# Patient Record
Sex: Female | Born: 1960 | Race: Asian | Hispanic: No | Marital: Single | Smoking: Never smoker
Health system: Southern US, Community
[De-identification: ages and names within clinical notes are randomized; demographics above are authoritative.]

## PROBLEM LIST (undated history)

## (undated) DIAGNOSIS — R51 Headache: Secondary | ICD-10-CM

## (undated) DIAGNOSIS — T8859XA Other complications of anesthesia, initial encounter: Secondary | ICD-10-CM

## (undated) DIAGNOSIS — T4145XA Adverse effect of unspecified anesthetic, initial encounter: Secondary | ICD-10-CM

## (undated) DIAGNOSIS — M1990 Unspecified osteoarthritis, unspecified site: Secondary | ICD-10-CM

## (undated) DIAGNOSIS — M549 Dorsalgia, unspecified: Secondary | ICD-10-CM

## (undated) HISTORY — PX: APPENDECTOMY: SHX54

---

## 1981-03-12 DIAGNOSIS — A150 Tuberculosis of lung: Secondary | ICD-10-CM | POA: Insufficient documentation

## 2000-12-30 ENCOUNTER — Other Ambulatory Visit: Admission: RE | Admit: 2000-12-30 | Discharge: 2000-12-30 | Payer: Self-pay | Admitting: *Deleted

## 2001-10-15 ENCOUNTER — Ambulatory Visit (HOSPITAL_COMMUNITY): Admission: RE | Admit: 2001-10-15 | Discharge: 2001-10-15 | Payer: Self-pay | Admitting: Obstetrics & Gynecology

## 2001-10-15 ENCOUNTER — Encounter (INDEPENDENT_AMBULATORY_CARE_PROVIDER_SITE_OTHER): Payer: Self-pay | Admitting: Specialist

## 2002-06-10 ENCOUNTER — Inpatient Hospital Stay (HOSPITAL_COMMUNITY): Admission: EM | Admit: 2002-06-10 | Discharge: 2002-06-11 | Payer: Self-pay | Admitting: Psychiatry

## 2002-06-10 ENCOUNTER — Emergency Department (HOSPITAL_COMMUNITY): Admission: EM | Admit: 2002-06-10 | Discharge: 2002-06-10 | Payer: Self-pay | Admitting: Emergency Medicine

## 2009-11-16 ENCOUNTER — Ambulatory Visit: Payer: Self-pay | Admitting: Internal Medicine

## 2009-11-16 DIAGNOSIS — F17200 Nicotine dependence, unspecified, uncomplicated: Secondary | ICD-10-CM | POA: Insufficient documentation

## 2009-11-16 DIAGNOSIS — F329 Major depressive disorder, single episode, unspecified: Secondary | ICD-10-CM

## 2009-11-16 DIAGNOSIS — K219 Gastro-esophageal reflux disease without esophagitis: Secondary | ICD-10-CM

## 2009-12-20 ENCOUNTER — Ambulatory Visit: Payer: Self-pay | Admitting: Internal Medicine

## 2009-12-20 DIAGNOSIS — M76899 Other specified enthesopathies of unspecified lower limb, excluding foot: Secondary | ICD-10-CM | POA: Insufficient documentation

## 2010-01-19 ENCOUNTER — Emergency Department (HOSPITAL_COMMUNITY)
Admission: EM | Admit: 2010-01-19 | Discharge: 2010-01-19 | Payer: Self-pay | Source: Home / Self Care | Admitting: Family Medicine

## 2010-01-19 ENCOUNTER — Telehealth (INDEPENDENT_AMBULATORY_CARE_PROVIDER_SITE_OTHER): Payer: Self-pay | Admitting: Internal Medicine

## 2010-01-27 ENCOUNTER — Telehealth (INDEPENDENT_AMBULATORY_CARE_PROVIDER_SITE_OTHER): Payer: Self-pay | Admitting: Internal Medicine

## 2010-01-30 ENCOUNTER — Ambulatory Visit: Payer: Self-pay | Admitting: Nurse Practitioner

## 2010-01-30 DIAGNOSIS — M5430 Sciatica, unspecified side: Secondary | ICD-10-CM | POA: Insufficient documentation

## 2010-02-07 ENCOUNTER — Telehealth (INDEPENDENT_AMBULATORY_CARE_PROVIDER_SITE_OTHER): Payer: Self-pay | Admitting: Internal Medicine

## 2010-02-07 ENCOUNTER — Encounter (INDEPENDENT_AMBULATORY_CARE_PROVIDER_SITE_OTHER): Payer: Self-pay | Admitting: Internal Medicine

## 2010-03-03 ENCOUNTER — Encounter (INDEPENDENT_AMBULATORY_CARE_PROVIDER_SITE_OTHER): Payer: Self-pay | Admitting: *Deleted

## 2010-04-03 ENCOUNTER — Encounter: Payer: Self-pay | Admitting: Family Medicine

## 2010-04-11 ENCOUNTER — Ambulatory Visit: Admit: 2010-04-11 | Payer: Self-pay | Admitting: Internal Medicine

## 2010-04-11 ENCOUNTER — Encounter (INDEPENDENT_AMBULATORY_CARE_PROVIDER_SITE_OTHER): Payer: Self-pay | Admitting: Internal Medicine

## 2010-04-11 ENCOUNTER — Encounter: Payer: Self-pay | Admitting: Internal Medicine

## 2010-04-11 DIAGNOSIS — I8390 Asymptomatic varicose veins of unspecified lower extremity: Secondary | ICD-10-CM | POA: Insufficient documentation

## 2010-04-11 NOTE — Progress Notes (Signed)
Summary: MEDICINE IS NOT WORKING  Phone Note Call from Patient   Summary of Call: PLEASE CALL PATIENT MEDICINE IS NOT WORKING SHE NEEDS TO TALK TO DR  Delrae Alfred, PHONE [phone] Initial call taken by: Domenic Polite,  January 27, 2010 2:02 PM  Follow-up for Phone Call        Left message on answering machine for pt. to return call.  Dutch Quint RN  January 27, 2010 2:36 PM  Can't stand long, has trouble when walking.  Right leg/hip is very painful.  Giving her trouble at work, is able to sleep at night.   Dx with sciatica at [facility],  completed predpack.  Has tried heating pad; when resting, pain doesn't bother her.  States ibuprofen is not working, would like to know if there is anything else she can take.  Uses [facility] on [address]  [facility] notes are on your desk. Follow-up by: Dutch Quint RN,  January 27, 2010 2:51 PM  Additional Follow-up for Phone Call Additional follow up Details #1::        Reviewed ER visit She needs f/u scheduled with Dr. Delrae Alfred would advise everything that she is doing will only add starting neurontin 300mg  by mouth nightly.  This may make her sleepy but may help some with the pain. If no improvement after 3 nights with one pill may increase to 2 capsules by mouth nightly.  Shiela to fax rx to [facility] Additional Follow-up by: Lehman Prom FNP,  January 27, 2010 3:56 PM    Additional Follow-up for Phone Call Additional follow up Details #2::    Pt. notified of Rx and Kaelie Henigan's instructions.  Appt. with Dr. Delrae Alfred 02/21/10.  Dutch Quint RN  January 27, 2010 4:47 PM   New/Updated Medications: GABAPENTIN 300 MG CAPS (GABAPENTIN) One capsule by mouth nightly for 3 days then may increase to 2 capsules by mouth nightly Prescriptions: GABAPENTIN 300 MG CAPS (GABAPENTIN) One capsule by mouth nightly for 3 days then may increase to 2 capsules by mouth nightly  #50 x 0   Entered and Authorized by:   Lehman Prom FNP   Signed by:   Lehman Prom  FNP on 01/27/2010   Method used:   Printed then faxed to ...       [facility] [address] [phone]* (retail)       [address]       [place], [place]  [address]       Ph: [phone]       Fax: [phone]   RxID:   [id number]    X-ray  Procedure date:  01/19/2010  Findings:      lumbar - no acute findings

## 2010-04-11 NOTE — Progress Notes (Signed)
Summary: need to speak with the nurse.  Phone Note Call from Patient   Summary of Call: please call patient she is having bad right leg pain and the medicine that dr Delrae Alfred give to her is not working, she was mad that we don't have app. avaliable and she wants to change medicine for anotherone. phone [phone] Initial call taken by: Domenic Polite,  January 19, 2010 8:18 AM  Follow-up for Phone Call        Called pt. -- informed that she is currently at the urgent care. Requested her to call back if needed.  Dutch Quint RN  January 19, 2010 9:28 AM

## 2010-04-11 NOTE — Assessment & Plan Note (Signed)
Summary: 2 MONTH FU FOR TOBACCO CESSATION//KT   Vitals Entered By: Gaylyn Cheers RN (February 07, 2010 11:38 AM) CC: pt here to see MD however MD not available. severe pain rt hip and leg, has been taking Ibuprofen, oxycodone, gabapentin with little  relief. Almost out of Ibuprofen will discuss with Dr. Delrae Alfred. PT ordered pt aware   CC:  pt here to see MD however MD not available. severe pain rt hip and leg, has been taking Ibuprofen, oxycodone, and gabapentin with little  relief. Almost out of Ibuprofen will discuss with Dr. Delrae Alfred. PT ordered pt aware.  History of Present Illness: Patient not seen today as Sylvia [name] had to leave office emergently. Has been seen now 3 times for what has been diagnosed as sciatica and right greater trochanteric bursitis.  Lumbosacral Xrays in ED were normal.  Has been treated with hydrocodone, prednisone dose pak, Depomedrol, Toradol, Ibuprofen with little improvement.   Needs to go to PT. Order written. Should be reappointed with me next week.  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 1 tab by mouth up to three times a day with food.    Vicodin 5-500 Mg Tabs (Hydrocodone-acetaminophen) ..... One tablet by mouth daily as needed for extreme pain  Orders: Physical Therapy Referral (PT) Physical Therapy Referral (PT)  Complete Medication List: 1)  Effexor Xr 75 Mg Xr24h-cap (Venlafaxine hcl) .Marland Kitchen.. 1 cap by mouth daily 2)  Famotidine 40 Mg Tabs (Famotidine) .Marland Kitchen.. 1 tab by mouth daily at bedtime 3)  Chantix Starting Month Pak 0.5 Mg X 11 & 1 Mg X 42 Tabs (Varenicline tartrate) .... Take 0.5 mg once daily for 3 days, then increase to two times a day.  on day 8, increase to 1 mg two times  a day and stay on that dose 4)  Chantix 1 Mg Tabs (Varenicline tartrate) .Marland Kitchen.. 1 tab by mouth two times a day 5)  Ibuprofen 600 Mg Tabs (Ibuprofen) .Marland Kitchen.. 1 tab by mouth up to three times a day with food. 6)  Gabapentin 300 Mg Caps (Gabapentin) .... One capsule by mouth nightly for 3 days then may increase to 2 capsules by mouth nightly 7)  Vicodin 5-500 Mg Tabs (Hydrocodone-acetaminophen) .... One tablet by mouth daily as needed for extreme pain  Patient Instructions: 1)  Reappoint with me next week on Monday. 2)  She is using the Vicodin too frequently--went through 30 in 9 days. 3)  Will give her 10 more--to only use when pain severe--please call into pharmacy of her choice--see order 4)  Have her try Aleve 2 tabs two times a day regularly --take with food.  Stop all other antiinflammatory meds. 5)  She is to let us know if she does not hear from PT in next 1-2 weeks. Prescriptions: VICODIN 5-500 MG TABS (HYDROCODONE-ACETAMINOPHEN) One tablet by mouth daily as needed for extreme pain  #10 x 0   Entered and Authorized by:   Julieanne Manson MD   Signed by:   Julieanne Manson MD on 02/07/2010   Method used:   Telephoned to ...       [facility] [address] [phone]* (retail)       [address]       [place]  [place], [place]  [address]       Ph: [phone]       Fax: [phone]   RxID:   [id number]    Orders Added: 1)  Physical Therapy Referral [PT] 2)  Physical Therapy Referral [PT]

## 2010-04-11 NOTE — Assessment & Plan Note (Signed)
Summary: Sciatica   Vital Signs:  Patient profile:   50 year old female Menstrual status:  postmenopausal Weight:      105.4 pounds Temp:     97.5 degrees F oral Pulse rate:   100 / minute Pulse rhythm:   regular Resp:     20 per minute BP sitting:   100 / 80  (left arm) Cuff size:   regular  Vitals Entered By: Levon Hedger (January 30, 2010 10:25 AM) CC: right hip pain  x 2-3 months went to [facility]  and medication given is not working...alot of discomfort and pain when walking Is Patient Diabetic? No Pain Assessment Patient in pain? yes     Location: muscle, hip Intensity: 10 Onset of pain  Constant  Does patient need assistance? Functional Status Self care Ambulation Normal   CC:  right hip pain  x 2-3 months went to [facility]  and medication given is not working...alot of discomfort and pain when walking.  History of Present Illness:  Pt into the office for f/u on right leg pain. Pt was seen in the urgent care of 01/19/2010 and was prescribed prednisone taper and ibuprofen and symptoms did not improve. She called to this office on last week and was ordered gabapentin which she started 3 days ago. She reports that it makes her sleepy. Pain in only in her right hip and right calf no radiation of pain - no back pain Pt reports that she was bowling on the day prior to onset of pain. 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 Pt is off work today.  Allergies (verified): No Known Drug Allergies  Review of Systems General:  Denies fever. CV:  Denies chest pain or discomfort. Resp:  Denies cough. GI:  Denies abdominal pain, nausea, and vomiting. GU:  Denies discharge and urinary frequency. MS:  Complains of joint pain; right hip pain, right leg pain.  Physical Exam  General:  alert.   Head:  normocephalic.   Lungs:  normal breath  sounds.   Heart:  normal rate and regular rhythm.   Abdomen:  normal bowel sounds.   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 1 tab by mouth up to three times a day with food.    Vicodin 5-500 Mg Tabs (Hydrocodone-acetaminophen) ..... One tablet by mouth daily as needed for extreme pain  Orders: Admin of Therapeutic Inj  intramuscular or subcutaneous ([address]) Ketorolac-Toradol 15mg  (Y7829) Depo- Medrol 80mg  (J1040)  Problem # 2:  TROCHANTERIC BURSITIS, RIGHT (ICD-726.5) pt may need physical therapy  Problem # 3:  GERD (ICD-530.81) advised pt to take medications since she is taking pain meds Her  updated medication list for this problem includes:    Famotidine 40 Mg Tabs (Famotidine) .Marland Kitchen... 1 tab by mouth daily at bedtime  Complete Medication List: 1)  Effexor Xr 75 Mg Xr24h-cap (Venlafaxine hcl) .Marland Kitchen.. 1 cap by mouth daily 2)  Famotidine 40 Mg Tabs (Famotidine) .Marland Kitchen.. 1 tab by mouth daily at bedtime 3)  Chantix Starting Month Pak 0.5 Mg X 11 & 1 Mg X 42 Tabs (Varenicline tartrate) .... Take 0.5 mg once daily for 3 days, then increase to two times a day.  on day 8, increase to 1 mg two times a day and stay on that dose 4)  Chantix 1 Mg Tabs (Varenicline tartrate) .Marland Kitchen.. 1 tab by mouth two times a day 5)  Ibuprofen 600 Mg Tabs (Ibuprofen) .Marland Kitchen.. 1 tab by mouth up to three times a day with food. 6)  Gabapentin 300 Mg Caps (Gabapentin) .... One capsule by mouth nightly for 3 days then  may increase to 2 capsules by mouth nightly 7)  Vicodin 5-500 Mg Tabs (Hydrocodone-acetaminophen) .... One tablet by mouth daily as needed for extreme pain  Patient Instructions: 1)  Increase gabapentin to 2 capsules by mouth nightly 2)  Continue to take ibuprofen 600mg  by mouth two times a day with food for inflammation - it is important that you keep taking this to help with swelling. 3)  May take vicodin daily as needed for pain.  This is a one time prescription of pain medications 4)  Avoid any activities that may inflame the area. 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 Patient Level III [99213] 2)  Admin of Therapeutic Inj  intramuscular or subcutaneous [96372] 3)  Ketorolac-Toradol 15mg  [J1885] 4)  Depo- Medrol 80mg  [J1040]     Medication Administration  Injection # 1:    Medication: Depo- Medrol 80mg     Diagnosis: SCIATICA, RIGHT (ICD-724.3)    Route: IM    Site: LUOQ gluteus    Exp  Date: 08/2012    Lot #: [id number]    Mfr: Pharmacia    Patient tolerated injection without complications    Given by: Levon Hedger (January 30, 2010 11:49 AM)  Injection # 2:    Medication: Ketorolac-Toradol 15mg     Diagnosis: SCIATICA, RIGHT (ICD-724.3)    Route: IM  Site: RUOQ gluteus    Exp Date: 10/12    Lot #: [id number]    Mfr: workhardt    Patient tolerated injection without complications    Given by: Levon Hedger (January 30, 2010 11:49 AM)  Orders Added: 1)  Est. Patient Level III [74259] 2)  Admin of Therapeutic Inj  intramuscular or subcutaneous [96372] 3)  Ketorolac-Toradol 15mg  [J1885] 4)  Depo- Medrol 80mg  [J1040]

## 2010-04-11 NOTE — Progress Notes (Signed)
Summary: Vicodin refill  Phone Note Outgoing Call   Summary of Call: Juliette Alcide:  please see patient instructions in note--call in Vicodin to pharmacy of her choice--no more until reevaluated next Monday. Please give her rest of info in the instructions. Initial call taken by: Julieanne Manson MD,  February 07, 2010 4:29 PM  Follow-up for Phone Call        Pt. notified, Rx called to [facility]. Has appt. 02/13/10. Will start Aleve. Follow-up by: Gaylyn Cheers RN,  February 07, 2010 4:49 PM

## 2010-04-11 NOTE — Assessment & Plan Note (Signed)
Summary: FU ON DEPRESSON/GERD/SMOKING//KT   Vital Signs:  Patient profile:   50 year old female Menstrual status:  postmenopausal Weight:      107.5 pounds Temp:     96.8 degrees F oral Pulse rate:   80 / minute Resp:     18 per minute BP sitting:   120 / 90  Vitals Entered By: Michelle Nasuti (December 20, 2009 10:45 AM) CC: FOLLOw-up visit. MED REFILLS NEEDED. C/O L HIP PAIN Pain Assessment Patient in pain? yes       Does patient need assistance? Ambulation Normal   CC:  FOLLOw-up visit. MED REFILLS NEEDED. C/O L HIP PAIN.  History of Present Illness: 1.  Depression:  Started back on name brand Effexor and feeling much better.  Sleeping better as well.    2.  Epigastric Pain:  has not been consistent with Famotidine--keeps forgetting to take at night.  Pain is not so frequent, however.  Has not made any changes related to eating and elevation HOB.  3.  Tobacco Abuse:   Did get Chantix--has not started yet as concerned she will get depressed.  4.  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  No numbness or tingling down leg  Allergies: No Known Drug Allergies  Physical Exam  General:  NAD Abdomen:  soft and non-tender.   Extremities:  Tender over right greater trochanter--reproduces pain in thigh and leg.  Discomfort with crossing right ankle over left knee in same area.   Impression & Recommendations:  Problem # 1:  DEPRESSION (ICD-311) Much improved Reiterated how to call in for refills so does not run out. Her updated medication list for this problem includes:    Effexor Xr 75 Mg Xr24h-cap (Venlafaxine hcl) .Marland Kitchen... 1 cap by mouth daily  Problem # 2:  GERD (ICD-530.81) To take  Famotidine in morning before breakfast Her updated medication list for this problem includes:    Famotidine 40 Mg Tabs (Famotidine) .Marland Kitchen... 1 tab by mouth daily at bedtime  Problem # 3:  TOBACCO ABUSE (ICD-305.1) To start Chantix after on Effexor  for one more month--to stop and call if increased depression Her updated medication list for this problem includes:    Chantix Starting Month Pak 0.5 Mg X 11 & 1 Mg X 42 Tabs (Varenicline tartrate) .Marland Kitchen... Take 0.5 mg once daily for 3 days, then increase to two times a day.  on day 8, increase to 1 mg two times a day and stay on that dose    Chantix 1 Mg Tabs (Varenicline tartrate) .Marland Kitchen... 1 tab by mouth two times a day  Problem # 4:  TROCHANTERIC BURSITIS, RIGHT (ICD-726.5) Ibuprofen and stretches discussed--pt. to stretch two times a day   Complete Medication List: 1)  Effexor Xr 75 Mg Xr24h-cap (Venlafaxine hcl) .Marland Kitchen.. 1 cap by mouth daily 2)  Famotidine 40 Mg Tabs (Famotidine) .Marland Kitchen.. 1 tab by mouth daily at bedtime 3)  Chantix Starting Month Pak 0.5 Mg X 11 & 1 Mg X 42 Tabs (Varenicline tartrate) .... Take 0.5 mg once daily for 3 days, then increase to two times a day.  on day 8, increase to 1 mg two times a day and stay on that dose 4)  Chantix 1 Mg Tabs (Varenicline tartrate) .Marland Kitchen.. 1 tab by mouth two times a day 5)  Ibuprofen 600 Mg Tabs (Ibuprofen) .Marland Kitchen.. 1 tab by mouth up to three times a day with food.  Patient Instructions: 1)  Hold Chantix until on Effexor for 1 more month.  If depression is well controlled then, start Chantix.  Remember to stop smoking on Day 8 when you go up to 1 mg two times a day  2)  Follow up with Dr. Delrae Alfred in 2 months for tobacco cessation 3)  To call if develops increasing depression on chantix--and stop Chantix also Prescriptions: IBUPROFEN 600 MG TABS (IBUPROFEN) 1 tab by mouth up to three times a day with food.  #90 x 0   Entered and Authorized by:   Julieanne Manson MD   Signed by:   Julieanne Manson MD on 12/20/2009   Method used:   Electronically to        [facility] [address] (retail)       [phone]       [place], [place]  [address]       Ph: [phone]       Fax: [phone]   RxID:   [id number]

## 2010-04-11 NOTE — Assessment & Plan Note (Signed)
Summary: NP-establish, needs effexor refill /tmm   Vital Signs:  Patient profile:   50 year old female Menstrual status:  postmenopausal Weight:      105.5 pounds Temp:     98.2 degrees F oral Pulse rate:   72 / minute Pulse rhythm:   regular Resp:     16 per minute BP sitting:   132 / 86  (left arm) Cuff size:   regular  Vitals Entered By: Michelle Nasuti (November 16, 2009 11:01 AM) CC: establish care 1. refills on effexor 2. c/o stomach pains 3. requests meds to help smoking cessation Pain Assessment Patient in pain? yes       Does patient need assistance? Functional Status Self care Ambulation Normal     Menstrual Status postmenopausal   CC:  establish care 1. refills on effexor 2. c/o stomach pains 3. requests meds to help smoking cessation.  History of Present Illness: 50 yo female originally from [place].  Has lived in [place] years or more.   Concerns:  1.  Depression:  diagnosed 5 years ago.  Has never seen a counselor or psychiatrist.  Previously followed by Elpidio Anis  PA-C at a [facility] office in [facility].  Started on Effexor at that point and has done well with it.  Has been on generic Effexor and does not believe it works as well.  Pt. has tried to wean off--even taking 37.5 mg, does not feel like her depression is controlled.  Currently taking the 37.5 mg.  2.  Abdominal discomfort:  Burning/poking epigastric pain.  Has had for years.  Worse after eating and often wakes at night--keeps her awake.  Smokes 1 ppd.  No alcohol.  Drinks 2-3 cups of coffee daily.  No soda.  Occasionally drinks iced tea.  No chocolate.  No onions or tomatoes.   Better after taking Zantac.  Does not use regularly as expensive.  3.  Tobacco Abuse:  Started smoking at age 35 yo.  About 41 pack year history.  Has tried nicotine patches--but ended up with a rash secondary to that.   Habits & Providers  Alcohol-Tobacco-Diet     Tobacco Status: current     Cigarette Packs/Day: 1.0  Current Medications (verified): 1)  Effexor Xr 37.5 Mg Xr24h-Cap (Venlafaxine Hcl) .... 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  1972:  Appendectomy in [place]  Family History: Mother, 31:  Lives in [place]:  DM, hypertension Father, died 85:  Alcohol related--sounds like cirrhosis 3 Brothers:  healthy 1Sister:  healthy Son, 14:  Healthy  Social History: Divorced Originally from [place]. [place].  Here in [place] since 1985. 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 Tobacco:  started age 14.  1 ppd Alcohol: none Drug:  neverSmoking Status:  current Packs/Day:  1.0  Physical Exam  General:  NAD Lungs:  Normal respiratory effort, chest expands symmetrically. Lungs are clear to auscultation, no crackles or wheezes. Heart:  Normal rate and regular rhythm. S1 and S2 normal without gallop, murmur, click, rub or other extra sounds. Abdomen:  Bowel sounds positive,abdomen soft and non-tender without masses, organomegaly or hernias noted.   Impression & Recommendations:  Problem # 1:  DEPRESSION (ICD-311) Switch to name brand if possible and increase back  to 75 mg  Her updated medication list for this problem includes:    Effexor Xr 75 Mg Xr24h-cap (Venlafaxine hcl) .Marland Kitchen... 1 cap by mouth daily  Problem # 2:  GERD (ICD-530.81) Discussed GERD precautions Her updated medication list for this problem includes:    Famotidine 40 Mg Tabs (Famotidine) .Marland Kitchen... 1 tab by mouth daily at bedtime  Problem # 3:  TOBACCO ABUSE (ICD-305.1) Will get her started on ICP for medication--note faxed to pharmacy not to give to pt. until she follows up with me to be sure her depression is stabilized. Her updated medication list for this problem  includes:    Chantix Starting Month Pak 0.5 Mg X 11 & 1 Mg X 42 Tabs (Varenicline tartrate) .Marland Kitchen... Take 0.5 mg once daily for 3 days, then increase to two times a day.  on day 8, increase to 1 mg two times a day and stay on that dose    Chantix 1 Mg Tabs (Varenicline tartrate) .Marland Kitchen... 1 tab by mouth two times a day  Complete Medication List: 1)  Effexor Xr 75 Mg Xr24h-cap (Venlafaxine hcl) .Marland Kitchen.. 1 cap by mouth daily 2)  Famotidine 40 Mg Tabs (Famotidine) .Marland Kitchen.. 1 tab by mouth daily at bedtime 3)  Chantix Starting Month Pak 0.5 Mg X 11 & 1 Mg X 42 Tabs (Varenicline tartrate) .... Take 0.5 mg once daily for 3 days, then increase to two times a day.  on day 8, increase to 1 mg two times a day and stay on that dose 4)  Chantix 1 Mg Tabs (Varenicline tartrate) .Marland Kitchen.. 1 tab by mouth two times a day  Patient Instructions: 1)  Elevate Head of bed.  Do not lie down for 2 hours after eating.  Avoid antiinflammatory medications. 2)  Do not start Chantix until after I see you back in 4-6 weeks. 3)  Follow up with Dr. Delrae Alfred in 4-6 weeks--depression, GERD, smoking. 4)  Call [phone] or [phone] for smoking cessation program with [facility] Prescriptions: CHANTIX 1 MG TABS (VARENICLINE TARTRATE) 1 tab by mouth two times a day  #60 x 2   Entered and Authorized by:   Julieanne Manson MD   Signed by:   Julieanne Manson MD on 11/16/2009   Method used:   Faxed to ...       [facility] (retail)       [address] [place], [place]  [address]       Ph: [phone]       Fax: [phone]   RxID:   [id number] CHANTIX STARTING MONTH PAK 0.5 MG X 11 & 1 MG X 42 TABS (VARENICLINE TARTRATE) Take 0.5 mg once daily for 3 days, then increase to two times a day.  On day 8, increase to 1 mg two times a day and stay on that dose  #1 x 0   Entered and Authorized by:   Julieanne Manson MD   Signed by:   Julieanne Manson MD on 11/16/2009   Method used:   Faxed to ...        [facility] (retail)       [address] [place], [place]  [address]       Ph: [phone]       Fax: [phone]   RxID:   [id number] FAMOTIDINE 40 MG TABS (FAMOTIDINE) 1 tab by mouth daily at bedtime  #  30 x 4   Entered and Authorized by:   Julieanne Manson MD   Signed by:   Julieanne Manson MD on 11/16/2009   Method used:   Faxed to ...       [facility] (retail)       [address] [place], [place]  [address]       Ph: [phone]       Fax: [phone]   RxID:   [id number] EFFEXOR XR 75 MG XR24H-CAP (VENLAFAXINE HCL) 1 cap by mouth daily  #30 x 4   Entered and Authorized by:   Julieanne Manson MD   Signed by:   Julieanne Manson MD on 11/16/2009   Method used:   Faxed to ...       [facility] (retail)       [address] [place], [place]  [address]       Ph: [phone]       Fax: [phone]   RxID:   [id number]

## 2010-04-13 NOTE — Letter (Signed)
Summary: *HSN Results Follow up  [facility]  [address] [place], [place] [address]   Phone: [phone]  Fax: [phone]      03/03/2010   GINA Slayden [address] [place], [place]  [address]   Dear  Ms. GINA Mcginty,                              Comments: WE HAVE BEEN TRYING TO REACH YOU PLEASE CALL  [phone] TO SCHEDULE AN APPT WITH PT . THANK YOU        _________________________________________________________ If you have any questions, please contact our office                     Sincerely,  Cheryll Dessert [facility]

## 2010-05-15 ENCOUNTER — Telehealth (INDEPENDENT_AMBULATORY_CARE_PROVIDER_SITE_OTHER): Payer: Self-pay | Admitting: Internal Medicine

## 2010-05-17 ENCOUNTER — Encounter (INDEPENDENT_AMBULATORY_CARE_PROVIDER_SITE_OTHER): Payer: Self-pay | Admitting: Internal Medicine

## 2010-05-17 ENCOUNTER — Encounter: Payer: Self-pay | Admitting: Internal Medicine

## 2010-05-17 ENCOUNTER — Telehealth (INDEPENDENT_AMBULATORY_CARE_PROVIDER_SITE_OTHER): Payer: Self-pay | Admitting: Internal Medicine

## 2010-05-23 NOTE — Assessment & Plan Note (Signed)
Summary: Right Leg Pain   Vital Signs:  Patient profile:   50 year old female Menstrual status:  postmenopausal Height:      61 inches Weight:      109.31 pounds BMI:     20.73 Temp:     97.7 degrees F oral Pulse rate:   88 / minute Pulse rhythm:   regular Resp:     20 per minute BP sitting:   126 / 88  (left arm) Cuff size:   regular  Vitals Entered By: Hale Drone CMA (April 11, 2010 4:07 PM) CC: RIght leg pain x5 months. When resting she has no pain but when she starts to walk or do any physical acitivity it becomes very painful. Also needs refill on Effexor 75mg .  Came in on 01/30/10 and was given Depo-Medrol 80mg  and  Ketarolac Toradol 15mg   and wants to know if she can get that today for the pain??? Is Patient Diabetic? No Pain Assessment Patient in pain? yes     Location: right leg Intensity: 10 Type: stabbing/sharp Onset of pain  With activity  Does patient need assistance? Functional Status Self care Ambulation Normal   CC:  RIght leg pain x5 months. When resting she has no pain but when she starts to walk or do any physical acitivity it becomes very painful. Also needs refill on Effexor 75mg .  Came in on 01/30/10 and was given Depo-Medrol 80mg  and  Ketarolac Toradol 15mg   and wants to know if she can get that today for the pain???.  History of Present Illness: 1.   Right leg pain:  has been to Urgent care, was seen by Jesse Fall,  eventually was seen at [facility] medical clinic last month.  Pt. given unknown Im injection at Dr. Jearl Klinefelter, states it helped.  Has not been to physical therapy as ordered in October.    Pain started in 5 months ago when slipped on wet floor--cannot remember how she landed, but states her right side twisted before falling.  Pain started subsequently in bilateral low back and lateral upper thighs.  Pain ultimately radiated down to lateral right ankle--no longer has pain so much in back on either side.  Now, really only having pain on lateral  lower leg.    2.  Depression:  Effexor filled last week for a year.  Explained to pt.  Current Medications (verified): 1)  Effexor Xr 75 Mg Xr24h-Cap (Venlafaxine Hcl) .Marland Kitchen.. 1 Cap By Mouth Daily 2)  Famotidine 40 Mg Tabs (Famotidine) .Marland Kitchen.. 1 Tab By Mouth Daily At Bedtime 3)  Chantix Starting Month Pak 0.5 Mg X 11 & 1 Mg X 42 Tabs (Varenicline Tartrate) .... Take 0.5 Mg Once Daily For 3 Days, Then Increase To Two Times A Day.  On Day 8, Increase To 1 Mg Two Times A Day and Stay On That Dose 4)  Chantix 1 Mg Tabs (Varenicline Tartrate) .Marland Kitchen.. 1 Tab By Mouth Two Times A Day 5)  Ibuprofen 600 Mg Tabs (Ibuprofen) .Marland Kitchen.. 1 Tab By Mouth Up To Three Times A Day With Food. 6)  Gabapentin 300 Mg Caps (Gabapentin) .... One Capsule By Mouth Nightly For 3 Days Then May Increase To 2 Capsules By Mouth Nightly 7)  Diclofenac Sodium 75 Mg Tbec (Diclofenac Sodium) .Marland Kitchen.. 1 Tab By Mouth Twice Daily W/food As Needed For Pain -- Morrison Old, Pa-C @ Dr[facility]fel[facility] Office  Allergies (verified): No Known Drug Allergies  Physical Exam  Msk:  NT over low back  Extremities:  Tender over right greater trochanter. Varicosities of lower extremities. No edema Neurologic:  strength normal in all extremities, gait normal, and DTRs symmetrical and normal.     Impression & Recommendations:  Problem # 1:  VARICOSE VEINS, LOWER EXTREMITIES (ICD-454.9) Discussed elevating legs as much as possible Low sodium diet Orders: Elastic Support Stocking - Thigh High (L8130)  Problem # 2:  SCIATICA, RIGHT (ICD-724.3)  Her updated medication list for this problem includes:    Ibuprofen 600 Mg Tabs (Ibuprofen) .Marland Kitchen... 1 tab by mouth up to three times a day with food.    Diclofenac Sodium 75 Mg Tbec (Diclofenac sodium) .Marland Kitchen... 1 tab by mouth twice daily w/food as needed for pain -- tracey huber, pa-c @ dr. Jear[facility] Klinefelter office  Problem # 3:  TROCHANTERIC BURSITIS, RIGHT (ICD-726.5) Discussed Exercises  Problem # 4:  DEPRESSION  (ICD-311) Effexor recently already filled Her updated medication list for this problem includes:    Effexor Xr 75 Mg Xr24h-cap (Venlafaxine hcl) .Marland Kitchen... 1 cap by mouth daily  Complete Medication List: 1)  Effexor Xr 75 Mg Xr24h-cap (Venlafaxine hcl) .Marland Kitchen.. 1 cap by mouth daily 2)  Famotidine 40 Mg Tabs (Famotidine) .Marland Kitchen.. 1 tab by mouth daily at bedtime 3)  Chantix Starting Month Pak 0.5 Mg X 11 & 1 Mg X 42 Tabs (Varenicline tartrate) .... Take 0.5 mg once daily for 3 days, then increase to two times a day.  on day 8, increase to 1 mg two times a day and stay on that dose 4)  Chantix 1 Mg Tabs (Varenicline tartrate) .Marland Kitchen.. 1 tab by mouth two times a day 5)  Ibuprofen 600 Mg Tabs (Ibuprofen) .Marland Kitchen.. 1 tab by mouth up to three times a day with food. 6)  Gabapentin 300 Mg Caps (Gabapentin) .... One capsule by mouth nightly for 3 days then may increase to 2 capsules by mouth nightly 7)  Diclofenac Sodium 75 Mg Tbec (Diclofenac sodium) .Marland Kitchen.. 1 tab by mouth twice daily w/food as needed for pain -- tracey huber, pa-c @ dr. Jearl Klinefelter office  Other Orders: Flu Vaccine 29yrs + [phone]) Admin 1st Vaccine ([address]454)  Patient Instructions: 1)  Follow up with Dr. Delrae Alfred in 3 months   Right leg pain   Orders Added: 1)  Flu Vaccine 48yrs + [09811] 2)  Admin 1st Vaccine [90471] 3)  Est. Patient Level III [91478] 4)  Elastic Support Stocking - Thigh High [L8130]   Immunizations Administered:  Influenza Vaccine # 1:    Vaccine Type: Fluvax 3+    Site: left deltoid    Mfr: GlaxoSmithKline    Dose: 0.5 ml    Route: IM    Given by: Hale Drone CMA    Exp. Date: 09/09/2010    Lot #: [id number]    VIS given: 10/04/09 version given April 11, 2010.  Flu Vaccine Consent Questions:    Do you have a history of severe allergic reactions to this vaccine? no    Any prior history of allergic reactions to egg and/or gelatin? no    Do you have a sensitivity to the preservative Thimersol? no    Do you have a past history  of Guillan-Barre Syndrome? no    Do you currently have an acute febrile illness? no    Have you ever had a severe reaction to latex? no    Vaccine information given and explained to patient? yes    Are you currently pregnant? no   Immunizations Administered:  Influenza Vaccine # 1:  Vaccine Type: Fluvax 3+    Site: left deltoid    Mfr: GlaxoSmithKline    Dose: 0.5 ml    Route: IM    Given by: Hale Drone CMA    Exp. Date: 09/09/2010    Lot #: [id number]    VIS given: 10/04/09 version given April 11, 2010.

## 2010-05-23 NOTE — Progress Notes (Addendum)
  Phone Note Outgoing Call   Summary of Call: Ramon--I was looking at Ms. Hoctor's chart today and it looks like her PT referral from last visit did not go through--appears I may have cancelled.  I cannot recall if she did not want to go or if I accidentally did this or what--can you call and see if she is still having discomfort in her right leg and if she still would be willing to go to PT? Initial call taken by: Julieanne Manson MD,  May 15, 2010 5:10 PM  Follow-up for Phone Call        Pt. here today --see note Follow-up by: Julieanne Manson MD,  May 17, 2010 9:42 AM

## 2010-05-23 NOTE — Assessment & Plan Note (Addendum)
Summary: Leg pain   Vital Signs:  Patient profile:   50 year old female Menstrual status:  postmenopausal Weight:      110.31 pounds Temp:     98.2 degrees F oral Pulse rate:   71 / minute Pulse rhythm:   regular Resp:     22 per minute BP sitting:   126 / 82  (left arm) Cuff size:   regular  Vitals Entered By: Hale Drone CMA (May 17, 2010 9:13 AM) CC: Still having right leg pain radiating all the way down to her foot. Went to Dr. Tinnie Gens Hooper's office and was seen by Caswell Corwin, PA-C. Was given Indomethacin 50mg  for the pain.  Is Patient Diabetic? No Pain Assessment Patient in pain? yes     Location: right leg Intensity: 7/10  Does patient need assistance? Functional Status Self care Ambulation Normal   CC:  Still having right leg pain radiating all the way down to her foot. Went to Dr. Tinnie Gens Hooper's office and was seen by Caswell Corwin and PA-C. Was given Indomethacin 50mg  for the pain. Marland Kitchen  History of Present Illness: 1.  Varicose veins, trochanteric bursitis and sciatica:  Pt. states the compression stockings did not help, but only wore for 3 days.  Never heard from PT--for some reason, appears the order was cancelled.  Pt., unfortunately was told she needed to make another appt. rather than getting her scheduled with PT when she called in.  Here today as no better.  Again, pt. on her feet waitressing most days.  Current Medications (verified): 1)  Effexor Xr 75 Mg Xr24h-Cap (Venlafaxine Hcl) .Marland Kitchen.. 1 Cap By Mouth Daily 2)  Famotidine 40 Mg Tabs (Famotidine) .Marland Kitchen.. 1 Tab By Mouth Daily At Bedtime 3)  Chantix Starting Month Pak 0.5 Mg X 11 & 1 Mg X 42 Tabs (Varenicline Tartrate) .... Take 0.5 Mg Once Daily For 3 Days, Then Increase To Two Times A Day.  On Day 8, Increase To 1 Mg Two Times A Day and Stay On That Dose 4)  Chantix 1 Mg Tabs (Varenicline Tartrate) .Marland Kitchen.. 1 Tab By Mouth Two Times A Day 5)  Ibuprofen 600 Mg Tabs (Ibuprofen) .Marland Kitchen.. 1 Tab By Mouth Up To Three Times A  Day With Food. 6)  Gabapentin 300 Mg Caps (Gabapentin) .... One Capsule By Mouth Nightly For 3 Days Then May Increase To 2 Capsules By Mouth Nightly 7)  Diclofenac Sodium 75 Mg Tbec (Diclofenac Sodium) .Marland Kitchen.. 1 Tab By Mouth Twice Daily W/food As Needed For Pain -- Morrison Old, Pa-C @ Dr. Jear[facility] Klinefelter Office 8)  Indomethacin 50 Mg Caps (Indomethacin) .Marland Kitchen.. 1 Cap By Mouth 3 Times Daily As Needed For Pain W/food. Donalda Ewings, Pac)  Allergies (verified): No Known Drug Allergies  Physical Exam  Extremities:  Still tender over right greater trochanter.    Pt. requested injection.  Discussed risks benefits and pt. elected to proceed.  Right greater trochanteric area cleaned in sterile fashion.  3 CC of 1 % lidocaine injected subcutaneously and to deep to greater trochanter.  80 mg of Kenalog with 1/2 cc of lidocaine injected subsequently.  Pt. tolerated well without complication.   Impression & Recommendations:  Problem # 1:  TROCHANTERIC BURSITIS, RIGHT (ICD-726.5) Injected Orders: Physical Therapy Referral (PT) Joint Aspirate / Injection, Large (20610)  Problem # 2:  VARICOSE VEINS, LOWER EXTREMITIES (ICD-454.9) Encouraged to wear support stockings, though veins do not appear so prominent today.  Problem # 3:  SCIATICA, RIGHT (ICD-724.3)  The following medications were removed from the medication list:    Ibuprofen 600 Mg Tabs (Ibuprofen) .Marland Kitchen... 1 tab by mouth up to three times a day with food.    Diclofenac Sodium 75 Mg Tbec (Diclofenac sodium) .Marland Kitchen... 1 tab by mouth twice daily w/food as needed for pain -- tracey huber, pa-c @ dr. Jearl Klinefelter office Her updated medication list for this problem includes:    Indomethacin 25 Mg Caps (Indomethacin) .Marland Kitchen... 2 caps every 8 hours as needed for leg pain--take with food  Orders: Physical Therapy Referral (PT)  Complete Medication List: 1)  Effexor Xr 75 Mg Xr24h-cap (Venlafaxine hcl) .Marland Kitchen.. 1 cap by mouth daily 2)  Famotidine 40 Mg Tabs (Famotidine)  .Marland Kitchen.. 1 tab by mouth daily at bedtime 3)  Chantix Starting Month Pak 0.5 Mg X 11 & 1 Mg X 42 Tabs (Varenicline tartrate) .... Take 0.5 mg once daily for 3 days, then increase to two times a day.  on day 8, increase to 1 mg two times a day and stay on that dose 4)  Chantix 1 Mg Tabs (Varenicline tartrate) .Marland Kitchen.. 1 tab by mouth two times a day 5)  Gabapentin 300 Mg Caps (Gabapentin) .... One capsule by mouth nightly for 3 days then may increase to 2 capsules by mouth nightly 6)  Indomethacin 25 Mg Caps (Indomethacin) .... 2 caps every 8 hours as needed for leg pain--take with food  Patient Instructions: 1)  Follow up with Dr. Delrae Alfred in 3-4 months:  right leg pain 2)  Call if you do not hear from PT by next week. 3)  Call and be seen if you develop redness, swelling, dischrge or increased pain at injection site--or if you develop fever Prescriptions: INDOMETHACIN 25 MG CAPS (INDOMETHACIN) 2 caps every 8 hours as needed for leg pain--take with food  #180 x 0   Entered and Authorized by:   Julieanne Manson MD   Signed by:   Julieanne Manson MD on 05/17/2010   Method used:   Electronically to        [facility] [address] (retail)       [phone]       [place], [place]  [address]       Ph: [phone]       Fax: [phone]   RxID:   [id number]    Orders Added: 1)  Physical Therapy Referral [PT] 2)  Joint Aspirate / Injection, Large [20610]  Appended Document: Leg pain Ramon--please document medication--Kenalog--I accidentally signed off Nurse Visit   Allergies: No Known Drug Allergies  Medication Administration  Injection # 1:    Medication: Kenalog 10 mg inj    Diagnosis: TROCHANTERIC BURSITIS, RIGHT (ICD-726.5)    Route: IM    Site: RUOQ gluteus    Exp Date: 09/09/2011    Lot #: [id number]    Mfr: [employer]    Comments: NDC [id number]    Patient tolerated injection without complications    Given by: Dr. Marland Mcalpine  Orders Added: 1)  Kenalog 10 mg inj [J3301] 2)  Admin of Therapeutic Inj  intramuscular or subcutaneous [96372]    Clinical Lists Changes  Orders: Added new Service order of Kenalog 10 mg inj (K7425) - Signed Added new Service order of Admin of Therapeutic Inj  intramuscular or subcutaneous (95638) - Signed

## 2010-05-23 NOTE — Progress Notes (Signed)
Summary: PT referral  Phone Note Outgoing Call   Summary of Call: Nora--PT referral--not sure what happened with last referral--appears to have been cancelled--I don't know if I did that or what,  but need to get her there Initial call taken by: Julieanne Manson MD,  May 17, 2010 10:41 AM  Follow-up for Phone Call        eferral faxed to [facility] PT awaiting date and time of appt per Pollyann Samples  February 09, 2010 12:24 PM MAILED A LETTER 03-03-10 TO SCHEDULE AN APPT WITH PT.Marland KitchenCheryll Dessert  May 17, 2010 11:45 AM  This was after I saw her in January--just need to get her referred again.  I was looking at the referral order I wrote and it had Cancelled after it--not sure if it was something I accidentally clicked on or what, but anyway--need to rerefer.  Julieanne Manson MD  May 17, 2010 3:25 PM     Additional Follow-up for Phone Call Additional follow up Details #1::        SENT A REFERRAL TO [facility] THEY WILL CALL THE PT TO SCHEDULE AN APPT WITH PT [address] PH # [phone]  WAITING FOR AN APPT.Marland KitchenCheryll Dessert  May 18, 2010 4:32 PM

## 2010-06-05 ENCOUNTER — Ambulatory Visit: Payer: Self-pay | Attending: Internal Medicine | Admitting: Rehabilitative and Restorative Service Providers"

## 2010-06-05 DIAGNOSIS — M25569 Pain in unspecified knee: Secondary | ICD-10-CM | POA: Insufficient documentation

## 2010-06-05 DIAGNOSIS — M25559 Pain in unspecified hip: Secondary | ICD-10-CM | POA: Insufficient documentation

## 2010-06-05 DIAGNOSIS — IMO0001 Reserved for inherently not codable concepts without codable children: Secondary | ICD-10-CM | POA: Insufficient documentation

## 2010-06-07 ENCOUNTER — Ambulatory Visit: Payer: Self-pay | Admitting: Rehabilitative and Restorative Service Providers"

## 2010-06-08 ENCOUNTER — Encounter (INDEPENDENT_AMBULATORY_CARE_PROVIDER_SITE_OTHER): Payer: Self-pay | Admitting: Internal Medicine

## 2010-06-12 ENCOUNTER — Ambulatory Visit: Payer: Self-pay | Attending: Internal Medicine | Admitting: Physical Therapy

## 2010-06-12 DIAGNOSIS — M25559 Pain in unspecified hip: Secondary | ICD-10-CM | POA: Insufficient documentation

## 2010-06-12 DIAGNOSIS — M25569 Pain in unspecified knee: Secondary | ICD-10-CM | POA: Insufficient documentation

## 2010-06-12 DIAGNOSIS — IMO0001 Reserved for inherently not codable concepts without codable children: Secondary | ICD-10-CM | POA: Insufficient documentation

## 2010-06-13 ENCOUNTER — Encounter: Payer: Self-pay | Admitting: Rehabilitative and Restorative Service Providers"

## 2010-06-13 NOTE — Miscellaneous (Signed)
Summary: Rehab Report//INITIAL SUMMARY//  Rehab Report//INITIAL SUMMARY//   Imported By: Arta Bruce 06/08/2010 11:45:39  _____________________________________________________________________  External Attachment:    Type:   Image     Comment:   External Document

## 2010-06-15 ENCOUNTER — Ambulatory Visit: Payer: Self-pay | Admitting: Rehabilitative and Restorative Service Providers"

## 2010-06-20 ENCOUNTER — Ambulatory Visit: Payer: Self-pay | Admitting: Rehabilitative and Restorative Service Providers"

## 2010-06-21 ENCOUNTER — Ambulatory Visit: Payer: Self-pay | Admitting: Rehabilitative and Restorative Service Providers"

## 2010-06-27 ENCOUNTER — Encounter: Payer: Self-pay | Admitting: Rehabilitative and Restorative Service Providers"

## 2010-06-28 ENCOUNTER — Encounter: Payer: Self-pay | Admitting: Rehabilitative and Restorative Service Providers"

## 2010-07-28 NOTE — H&P (Signed)
NAMEMarland Kitchen  Sylvia, [name] NO.:  [id number]   MEDICAL RECORD NO.:  [id number]                   PATIENT TYPE:  IPS   LOCATION:  0301                                 FACILITY:  [facility]   PHYSICIAN:  Jeanice Lim, M.D.              DATE OF BIRTH:  [birth year]   DATE OF ADMISSION:  06/10/2002  DATE OF DISCHARGE:  06/11/2002                         PSYCHIATRIC ADMISSION ASSESSMENT   IDENTIFYING INFORMATION:  This is a 50 year old Asian female who is an  involuntary admission.  It is noted that this patient goes by the name Sylvia [name].  Interview is somewhat limited by language barrier and there is no  interpreter present.   HISTORY OF PRESENT ILLNESS:  This patient took approximately 30 to 60  melatonin 3 mg tablets last night over the course of the evening, trying to  get to sleep.  She reports poor sleep of approximately 2 to 5 hours per  night for the past month with constant worries after breaking up with her  boyfriend earlier this month and losing her job.  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  She has felt quite hopeless about the  future and feels inadequate to be able to provide financially and this has  frightened her.  She denies any overt suicidal ideation or homicidal  ideation, auditory or visual hallucination.  She denies that she was trying  to kill herself and yet does admit she took quite a large amount of  medications through the night.  She has approximately a 10 pound weight loss  this month after losing her job and boyfriend going from 105 pounds to 95  pounds.  She also endorses decreased appetite, difficulty concentrating, and  frequent crying episodes for the past month.   PAST PSYCHIATRIC HISTORY:  The patient has no previous history of  psychiatric problems.   SOCIAL HISTORY:  The patient has been in the [place] for the past 17  years.  She  has lived in [place] for the past 5 years.  She previously  lived in [place].  She lives with her 36-year-old son.  She has no legal  problems.  She has family in [place] is her birth country.  She  generally endorses a poor support here and when she lost her boyfriend whom  she had been living with for a long time has felt quite hopeless about how  to make contacts in the area.   FAMILY HISTORY:  Unclear.   ALCOHOL AND DRUG HISTORY:  The patient rarely uses alcohol and denies any  substance abuse.   MEDICAL HISTORY:  Not clear.  Medical problems include seasonal allergies.  She is status post melatonin ingestion which was cleared with poison control  in the [facility] Emergency Department.  She denies any overt medical  problems.   REVIEW OF SYSTEMS:  Remarkable for occasional stomachache with spicy food.  She frequently takes Tylenol for occasional headaches which are not frequent  and endorses a 10 pound weight loss within the past month.   MEDICATIONS:  There is some type of allergy medication that she takes and  she does not know the name of it.  She takes Depo-Provera injections 2 to 3  months for contraception.  She has been using melatonin to help with her  sleep.   DRUG ALLERGIES:  None.   POSITIVE PHYSICAL FINDINGS:  The patient's physical exam is noted in the  record, done by doctor exam in the emergency room and was essentially  negative.   DIAGNOSTIC STUDIES:  The patient's metabolic profile revealed normal  electrolytes.  Liver enzymes are elevated with AST of 160, ALT of 79.   MENTAL STATUS EXAMINATION:  This is a fully alert, Asian female who is in no  acute distress.  She does have an anxious affect but is cooperative and  polite.  She is quite tearful during the exam.  Speech is somewhat difficult  to understand because of her language barrier, but generally pace and tone  are normal.  No pressure.  Mood is anxious and depressed.  Thought processes   are logical and coherent.  She is primarily concerned today about getting  home to her son and she fears that he is being left with a neighbor that he  does not know well and that he is missing her.  No overt evidence of  suicidal or homicidal ideation at this point.  Cognitively, she is intact  and oriented x 3.  No evidence of psychosis or paranoia.   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   AXIS II:  No diagnosis.   AXIS III:  Elevated liver enzymes and status post melatonin ingestion.   AXIS IV:  Severe grief of her breakup with her boyfriend and stress due to  lack of income and lack of job.   AXIS V:  Current 38; past year 65.   PLAN:  Involuntarily admit the patient to stabilize her with the goal of  ensuring that she does not have suicidal ideation and that she can function  safely at home.  We are going to obtain an interpreter as soon as possible  to flush out some additional history, particularly related to her elevated  liver enzymes and be able to explore in more detail her psychiatric history  and also her support system.  We are going to recheck a liver panel on her  in the morning and we are going to refer her back to her PCP probably for  elevated liver enzymes.  At this point she is in agreement with that.  We  have elected to start her on Trazodone 50 mg p.o. q.h.s. and Klonopin 0.25  mg p.o. q.6h p.r.n. for anxiety.  She has been resistant regarding the idea  of taking antidepressant meds yet, but we had explained to her the purpose  of that and the benefits that she may receive and she is in agreement.  She  agrees to consider the possibility of antidepressant medication.  We will be  observing her closely and working with her over the next day or two.  Estimated length of stay is 2 days.        Margaret A. Scott, N.P.  Jeanice Lim, M.D.     MAS/MEDQ  D:  07/14/2002  T:  07/14/2002  Job:  [id number]

## 2010-07-28 NOTE — Op Note (Signed)
   NAME:  Sylvia [name], Sylvia [name]                                ACCOUNT NO.:  [id number]   MEDICAL RECORD NO.:  [id number]                   PATIENT TYPE:  AMB   LOCATION:  SDC                                  FACILITY:  [facility]   PHYSICIAN:  Genia Del, M.D.             DATE OF BIRTH:  [birth year]   DATE OF PROCEDURE:  10/15/2001  DATE OF DISCHARGE:                                 OPERATIVE REPORT   PREOPERATIVE DIAGNOSES:  An 8+ week gestation, nondesired.   POSTOPERATIVE DIAGNOSES:  An 8+ week gestation, nondesired.   INTERVENTION:  Dilatation and evacuation.   PROCEDURE:  Under MAC analgesia the patient was in lithotomy position.  She  was prepped with Betadine on the suprapubic, vulvar, and vaginal areas and  draped as usual.  The vaginal examination reveals an anteverted uterus  corresponding to about [redacted] weeks gestation.  No adnexal mass.  The cervix is  closed and long.  The bladder is emptied.  The speculum is introduced.  The  paracervical block is done at 4 o'clock and 8 o'clock with a total of 20 cc  of lidocaine 1%.  The anterior lip of the cervix is grasped with a  tenaculum.  Dilatation of the cervix is done with Hegar dilators up to 29  easily.  A 9 curved curette is used for suction.  Products of conception  corresponding to 7-[redacted] weeks gestation are removed and sent to pathology.  Then, a sharp curette is used to complete the curettage on all intrauterine  surfaces.  The uterine sound is well heard.  We then use the suction curette  once again to assure that the cavity is completely emptied including blood  clots.  The uterus is contracting well on the suction curette.  The  estimated blood loss was less than 50 cc.  The instruments were removed.  No  complications occurred and the patient was brought to recovery room in good  status.  Her blood group is A+.                                               Genia Del, M.D.    ML/MEDQ  D:  10/15/2001  T:  10/17/2001  Job:   [id number]

## 2010-07-28 NOTE — Discharge Summary (Signed)
NAMEMarland Kitchen  Sylvia, [name] NO.:  [id number]   MEDICAL RECORD NO.:  [id number]                   PATIENT TYPE:  IPS   LOCATION:  0301                                 [facility]ILITY[facility]   PHYSICIAN:  Jeanice Lim, M.D.              DATE OF BIRTH:  1960-03-20   DATE OF ADMISSION:  06/10/2002  DATE OF DISCHARGE:  06/11/2002                                 DISCHARGE SUMMARY   IDENTIFYING DATA:  This is a 50 year old Asian female involuntarily  committed after taking 30 melatonin tablets last night, reporting that she  wanted to sleep. She denied this being a true suicide attempt, but admitting  to recent stressors, including breaking up with boyfriend, losing job and  feeling depressed, as well as taking care of a 31-year-old son. The patient  had a 10 pound weight loss. No prior psychiatric treatment, nor prior  history of suicide attempts.   MEDICATIONS:  Depo-Provera.   ALLERGIES:  No known drug allergies.   PHYSICAL EXAMINATION:  Essentially within normal limits. Neurologically  nonfocal.   LABORATORY DATA:  Routine admission labs essentially within normal limits.  AST and ALT were elevated at 160. Her AST and ALT 79 on June 10, 2002. On  June 11, 2002, 132 and 76 respectively. TSH within normal limits. Liver  function tests trending down.   MENTAL STATUS EXAM:  Fully alert, anxious female, cooperative, polite.  Tearful at times. Speech clear. Mood anxious, depressed. Affect appropriate  to content. Thought process was goal directed, negative for psychotic  symptoms. The patient denied any suicidal thoughts. No homicidal ideation.  Cognitively intact. Judgment and insight fair.   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   AXIS II:  None.   AXIS III:  Elevated liver enzymes.   AXIS IV:  Moderate problems related to primary support system  and  occupation.   AXIS V:  30/70.   HOSPITAL COURSE:  The patient was admitted and we ordered routine  p.r.n.  medications. She underwent further monitoring. She was encouraged to  participate in individual, group and milieu therapy.  An interpreter was  utilized to facilitate translation due to the patient's limited English.   The patient reported multiple stressors and difficulty dealing with these  stressors. She admitted to feeling desperate prior to  admission, but now  feels that she is able to cope with stress, and reports having  accidentally  overdosed. She denied any suicidal. She reported motivation to pursue  medication treatment for depression as well as therapy. The patient was  quite open to recommendations and showed good judgment and insight, and  described a support system which was adequate.   CONDITION ON DISCHARGE:  The patient's condition was improved. Her mood was  less depressed. Her affect brighter, thought process is  goal directed,  thought content negative for any dangerous ideation or psychotic symptoms.  Judgment and insight had improved and the patient showed improved coping  skills.   DISCHARGE MEDICATIONS:  1. Trazodone 100 mg 1/2 to 1 q.h.s.  2. Paxil CR 12.5 mg q. a.m.   FOLLOW UP:  The patient was  to follow up with the [facility] for  support and [facility] on June 18, 2002, at 12:30,  for medication management and therapy.   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   AXIS II:  None.   AXIS III:  Elevated liver enzymes.   AXIS IV:  Moderate problems related to primary support system and  occupation.   AXIS VKallie Locks, M.D.    JEM/MEDQ  D:  06/25/2002  T:  06/26/2002  Job:  [id number]

## 2010-08-15 ENCOUNTER — Ambulatory Visit (INDEPENDENT_AMBULATORY_CARE_PROVIDER_SITE_OTHER): Payer: Self-pay | Admitting: Family Medicine

## 2010-08-15 ENCOUNTER — Encounter: Payer: Self-pay | Admitting: Family Medicine

## 2010-08-15 DIAGNOSIS — G5700 Lesion of sciatic nerve, unspecified lower limb: Secondary | ICD-10-CM | POA: Insufficient documentation

## 2010-08-15 DIAGNOSIS — M5430 Sciatica, unspecified side: Secondary | ICD-10-CM

## 2010-08-15 MED ORDER — PREDNISONE 10 MG PO TABS: ORAL_TABLET | ORAL | Status: DC

## 2010-08-15 MED ORDER — PREDNISONE 10 MG PO TABS: ORAL_TABLET | ORAL | Status: AC

## 2010-08-15 NOTE — Patient Instructions (Signed)

## 2010-08-15 NOTE — Progress Notes (Signed)
Very pleasant 50 year old female seen today for evaluation of persistent RIGHT radiculopathy. About 10 months ago, the patient twisted and had some pain in the posterior buttocks and lower back region. Since that time she has had multiple treatment modalities including pain medications, a 5 day short burst of prednisone, gabapentin, and inflammatory use, and apparently at some point she had some trochanteric bursitis presumably, and resumed a trochanteric bursa injection. At this point, she continues to have persistent pain and radiculopathy located primarily in the RIGHT buttocks and down her RIGHT leg.  No gross incontinence of bowel or bladder. No saddle anesthesia. She does have some tingling as noted.  She also has done a 5 day stint in the physical therapy.  Patient Active Problem List  Diagnoses  . PULMONARY TUBERCULOSIS  . TOBACCO ABUSE  . DEPRESSION  . GERD  . SCIATICA, RIGHT  . VARICOSE VEINS, LOWER EXTREMITIES  . Piriformis syndrome   No past medical history on file. No past surgical history on file. History  Substance Use Topics  . Smoking status: Former Games developer  . Smokeless tobacco: Never Used  . Alcohol Use: Not on file   No family history on file. No Known Allergies No current outpatient prescriptions on file prior to visit.   REVIEW OF SYSTEMS  GEN: No fevers, chills. Nontoxic. Primarily MSK c/o today. MSK: Detailed in the HPI GI: tolerating PO intake without difficulty Neuro: detailed above Otherwise the pertinent positives of the ROS are noted above.    Physical Exam  Blood pressure 125/88, height 5\' 2"  (1.575 m), weight 108 lb (48.988 kg).  Gen: Well-developed,well-nourished,in no acute distress; alert,appropriate and cooperative throughout examination HEENT: Normocephalic and atraumatic without obvious abnormalities.  Ears, externally no deformities Pulm: Breathing comfortably in no respiratory distress Range of motion at  the waist: Flexion:  normal Extension: normal Lateral bending: normal Rotation: all normal  No echymosis or edema Rises to examination table with no difficulty Gait: non antalgic  Inspection/Deformity: N Paraspinus T: NT  B Ankle Dorsiflexion (L5,4): 5/5 B Great Toe Dorsiflexion (L5,4): 5/5 Heel Walk (L5): WNL Toe Walk (S1): WNL Rise/Squat (L4): WNL  SENSORY B Medial Foot (L4): WNL B Dorsum (L5): WNL B Lateral (S1): WNL Light Touch: WNL Pinprick: WNL  REFLEXES Knee (L4): 2+ Ankle (S1): 2+  B SLR, seated: neg B SLR, supine: neg B FABER: neg B Reverse FABER: + on R TTP DIRECTLY AT PIRIFORMIS B Greater Troch: NT B Log Roll: neg B Stork: NT B Sciatic Notch: NT Leg Lengths: equal  A/P: 1. Radiculopathy, R. I think most c/w piriformis given exam. Given length of time, I cannot exclude foraminal impingement, so I will do a longer course of steroids.  Also given a handout with more extensive Piriformis stretching, hip flexor and abductor strengthening, ham stretching  Rec deep massage, explained self-massage with ball  Simplify rehab - discussed specific piriformis rehab to be done multiple times a day.  Recheck 4-6 weeks  Cc: Dr. Delrae Alfred

## 2010-09-12 ENCOUNTER — Ambulatory Visit (INDEPENDENT_AMBULATORY_CARE_PROVIDER_SITE_OTHER): Payer: Self-pay | Admitting: Family Medicine

## 2010-09-12 ENCOUNTER — Encounter: Payer: Self-pay | Admitting: Family Medicine

## 2010-09-12 DIAGNOSIS — M5430 Sciatica, unspecified side: Secondary | ICD-10-CM

## 2010-09-12 DIAGNOSIS — G5700 Lesion of sciatic nerve, unspecified lower limb: Secondary | ICD-10-CM

## 2010-09-12 MED ORDER — TRAMADOL HCL 50 MG PO TABS: 50.0000 mg | ORAL_TABLET | Freq: Four times a day (QID) | ORAL | Status: AC | PRN

## 2010-09-12 MED ORDER — DICLOFENAC SODIUM 75 MG PO TBEC: 75.0000 mg | DELAYED_RELEASE_TABLET | Freq: Two times a day (BID) | ORAL | Status: DC

## 2010-09-12 NOTE — Patient Instructions (Addendum)
Now, get MRI of spine:  We will see results, then that will determine f/u:  MRI is scheduled for 09/18/10 at 2:45 pm at [facility].  Go to the radiology department.

## 2010-09-12 NOTE — Progress Notes (Signed)
Sylvia [name], a 50 y.o. female presents today in the office for the following:    F/u R sided radiculopathy, buttocks pain, radiculopathy down leg and into calf. Persistent x 11 months. Recently went on 2 weeks of prednisone, has used neurontin in the past, NSAIDS. Was felt to have troch bursitis and had a GTB inj and a toradol inj.   Continues to have significant limiting pain. I felt mostly piriformis on last OV, gave classic rehab.  08/15/2010 OV Very pleasant 50 year old female seen today for evaluation of persistent RIGHT radiculopathy. About 10 months ago, the patient twisted and had some pain in the posterior buttocks and lower back region. Since that time she has had multiple treatment modalities including pain medications, a 5 day short burst of prednisone, gabapentin, and inflammatory use, and apparently at some point she had some trochanteric bursitis presumably, and resumed a trochanteric bursa injection. At this point, she continues to have persistent pain and radiculopathy located primarily in the RIGHT buttocks and down her RIGHT leg.  No gross incontinence of bowel or bladder. No saddle anesthesia. She does have some tingling as noted.  She also has done a 5 day stint in the physical therapy.  Patient Active Problem List  Diagnoses  . PULMONARY TUBERCULOSIS  . TOBACCO ABUSE  . DEPRESSION  . GERD  . SCIATICA, RIGHT  . VARICOSE VEINS, LOWER EXTREMITIES  . Piriformis syndrome   No past medical history on file. No past surgical history on file. History  Substance Use Topics  . Smoking status: Former Games developer  . Smokeless tobacco: Never Used  . Alcohol Use: Not on file   No family history on file. No Known Allergies Current Outpatient Prescriptions on File Prior to Visit  Medication Sig Dispense Refill  . famotidine (PEPCID) 40 MG tablet Take 40 mg by mouth at bedtime.        Marland Kitchen venlafaxine (EFFEXOR-XR) 75 MG 24 hr capsule Take 75 mg by mouth daily.         REVIEW OF  SYSTEMS  GEN: No fevers, chills. Nontoxic. Primarily MSK c/o today. MSK: Detailed in the HPI GI: tolerating PO intake without difficulty Neuro: detailed above Otherwise the pertinent positives of the ROS are noted above.    Physical Exam  Blood pressure 131/88, pulse 82.   Gen: Well-developed,well-nourished,in no acute distress; alert,appropriate and cooperative throughout examination HEENT: Normocephalic and atraumatic without obvious abnormalities.  Ears, externally no deformities Pulm: Breathing comfortably in no respiratory distress Range of motion at  the waist: Flexion: normal Extension: normal Lateral bending: normal Rotation: all normal  No echymosis or edema Rises to examination table with no difficulty Gait: non antalgic  Inspection/Deformity: N Paraspinus T: NT  B Ankle Dorsiflexion (L5,4): 5/5 B Great Toe Dorsiflexion (L5,4): 5/5 Heel Walk (L5): WNL Toe Walk (S1): WNL Rise/Squat (L4): WNL  SENSORY B Medial Foot (L4): WNL B Dorsum (L5): WNL B Lateral (S1): WNL Light Touch: WNL Pinprick: WNL  REFLEXES Knee (L4): 2+ Ankle (S1): 2+  B SLR, seated: neg B SLR, supine: neg B FABER: neg B Reverse FABER: + on R TTP DIRECTLY AT PIRIFORMIS B Greater Troch: NT B Log Roll: neg B Stork: NT B Sciatic Notch: NT Leg Lengths: equal  A/P: 1. Radiculopathy, R. MRI of the lumbar spine without contrast. Given length of time, I cannot exclude foraminal impingement, or potential cord involvement.   Rec deep massage, explained self-massage with ball, hip and piriformis rehab.   Voltaren and Tramadol I discussed  increasing effexor, which can be helpful in chronic radiculopathy cases, but she was reluctant, so I will hold off on this for now.

## 2010-09-18 ENCOUNTER — Ambulatory Visit (HOSPITAL_COMMUNITY)
Admission: RE | Admit: 2010-09-18 | Discharge: 2010-09-18 | Disposition: A | Discharge status: Home / Self Care | Payer: Self-pay | Source: Ambulatory Visit | Attending: Family Medicine | Admitting: Family Medicine

## 2010-09-18 DIAGNOSIS — M5430 Sciatica, unspecified side: Secondary | ICD-10-CM

## 2010-09-18 DIAGNOSIS — M5126 Other intervertebral disc displacement, lumbar region: Secondary | ICD-10-CM | POA: Insufficient documentation

## 2010-09-18 DIAGNOSIS — M48061 Spinal stenosis, lumbar region without neurogenic claudication: Secondary | ICD-10-CM | POA: Insufficient documentation

## 2010-09-18 DIAGNOSIS — M545 Low back pain, unspecified: Secondary | ICD-10-CM | POA: Insufficient documentation

## 2010-09-18 DIAGNOSIS — M79609 Pain in unspecified limb: Secondary | ICD-10-CM | POA: Insufficient documentation

## 2010-09-29 ENCOUNTER — Encounter: Payer: Self-pay | Admitting: *Deleted

## 2010-09-29 NOTE — Progress Notes (Signed)
Referral info faxed to [facility] for Pain management today.

## 2010-10-10 ENCOUNTER — Ambulatory Visit (INDEPENDENT_AMBULATORY_CARE_PROVIDER_SITE_OTHER): Payer: Self-pay | Admitting: Family Medicine

## 2010-10-10 DIAGNOSIS — M5430 Sciatica, unspecified side: Secondary | ICD-10-CM

## 2010-10-10 DIAGNOSIS — M5126 Other intervertebral disc displacement, lumbar region: Secondary | ICD-10-CM

## 2010-10-10 MED ORDER — PREDNISONE 20 MG PO TABS: ORAL_TABLET | ORAL | Status: DC

## 2010-10-10 MED ORDER — CYCLOBENZAPRINE HCL 10 MG PO TABS: 10.0000 mg | ORAL_TABLET | Freq: Every day | ORAL | Status: AC

## 2010-10-10 NOTE — Progress Notes (Signed)
Sylvia [name], a 50 y.o. female presents today in the office for the following:    Followup post MRI and followup on persistent symptoms for a year. Patient has had lumbar radiculopathy with a failure to progress despite a number of treatments including physical therapy, multiple anti-inflammatories, 1 trial of gabapentin, and a steroid taper. She was also felt to have some trochanteric bursitis in the past, but none clinically during my examinations.  MRI, lumbar spine. Reviewed the films with the patient in the office. I showed her the films that showed an significant L4-5 lumbar herniation.  She thinks that she may be slightly better, and she has some relief of symptoms when taking her medications for 2-3 hours, but then her symptoms returned. She also has some relief with using an Ace bandage on her lower extremity.  09/12/2010 F/u R sided radiculopathy, buttocks pain, radiculopathy down leg and into calf. Persistent x 11 months. Recently went on 2 weeks of prednisone, has used neurontin in the past, NSAIDS. Was felt to have troch bursitis and had a GTB inj and a toradol inj.   Continues to have significant limiting pain. I felt mostly piriformis on last OV, gave classic rehab.  08/15/2010 OV Very pleasant 50 year old female seen today for evaluation of persistent RIGHT radiculopathy. About 10 months ago, the patient twisted and had some pain in the posterior buttocks and lower back region. Since that time she has had multiple treatment modalities including pain medications, a 5 day short burst of prednisone, gabapentin, and inflammatory use, and apparently at some point she had some trochanteric bursitis presumably, and resumed a trochanteric bursa injection. At this point, she continues to have persistent pain and radiculopathy located primarily in the RIGHT buttocks and down her RIGHT leg.  No gross incontinence of bowel or bladder. No saddle anesthesia. She does have some tingling as noted.  She  also has done a 5 day stint in the physical therapy.  Patient Active Problem List  Diagnoses  . PULMONARY TUBERCULOSIS  . TOBACCO ABUSE  . DEPRESSION  . GERD  . SCIATICA, RIGHT  . VARICOSE VEINS, LOWER EXTREMITIES  . Herniated nucleus pulposus, L4-5   No past medical history on file. No past surgical history on file. History  Substance Use Topics  . Smoking status: Former Games developer  . Smokeless tobacco: Never Used  . Alcohol Use: Not on file   No family history on file. No Known Allergies Current Outpatient Prescriptions on File Prior to Visit  Medication Sig Dispense Refill  . diclofenac (VOLTAREN) 75 MG EC tablet Take 1 tablet (75 mg total) by mouth 2 (two) times daily.  60 tablet  3  . famotidine (PEPCID) 40 MG tablet Take 40 mg by mouth at bedtime.        Marland Kitchen venlafaxine (EFFEXOR-XR) 75 MG 24 hr capsule Take 75 mg by mouth daily.         REVIEW OF SYSTEMS  GEN: No fevers, chills. Nontoxic. Primarily MSK c/o today. MSK: Detailed in the HPI GI: tolerating PO intake without difficulty Neuro: detailed above Otherwise the pertinent positives of the ROS are noted above.    Physical Exam  Blood pressure 146/84.   Gen: Well-developed,well-nourished,in no acute distress; alert,appropriate and cooperative throughout examination HEENT: Normocephalic and atraumatic without obvious abnormalities.  Ears, externally no deformities Pulm: Breathing comfortably in no respiratory distress Range of motion at  the waist: Flexion: normal Extension: normal Lateral bending: normal Rotation: all normal  No echymosis or edema Rises  to examination table with no difficulty Gait: non antalgic  Inspection/Deformity: N Paraspinus T: NT  B Ankle Dorsiflexion (L5,4): 5/5 B Great Toe Dorsiflexion (L5,4): 5/5 Foot plantar and dorsiflexion 5/5  SENSORY B Medial Foot (L4): WNL B Dorsum (L5): WNL B Lateral (S1): WNL Light Touch: WNL Pinprick: Decreased on the R distal lateral leg and  dorsum foot   B SLR, seated: neg B SLR, supine: neg B FABER: neg B Reverse FABER: + on R B Greater Troch: NT B Sciatic Notch: NT  A/P: L4-5 disc herniation.  Given the location and distribution of her pain as well as some decreased sensation, think this is most likely secondary to spine origin with foraminal impingement most likely.  We discussed various treatment options including continued conservative management, other interventional therapies including other interventional spine injections, or surgical consultation. Finances are limited in this case. The patient would like to proceed conservatively if there is any chance for improvement. I think that is reasonable. We are going to do another 14 day steroid taper, and add Flexeril 10 mg at nighttime

## 2010-10-10 NOTE — Patient Instructions (Signed)
Recheck in 5-6 weeks

## 2010-10-24 ENCOUNTER — Other Ambulatory Visit: Payer: Self-pay | Admitting: *Deleted

## 2010-10-24 MED ORDER — PREDNISONE 20 MG PO TABS: ORAL_TABLET | ORAL | Status: DC

## 2010-11-23 ENCOUNTER — Ambulatory Visit (INDEPENDENT_AMBULATORY_CARE_PROVIDER_SITE_OTHER): Payer: Self-pay | Admitting: Family Medicine

## 2010-11-23 DIAGNOSIS — M5126 Other intervertebral disc displacement, lumbar region: Secondary | ICD-10-CM

## 2010-11-23 DIAGNOSIS — M5430 Sciatica, unspecified side: Secondary | ICD-10-CM

## 2010-11-23 MED ORDER — TRAMADOL HCL 50 MG PO TABS: 50.0000 mg | ORAL_TABLET | Freq: Four times a day (QID) | ORAL | Status: AC | PRN

## 2010-11-23 MED ORDER — CYCLOBENZAPRINE HCL 10 MG PO TABS: 10.0000 mg | ORAL_TABLET | Freq: Three times a day (TID) | ORAL | Status: AC | PRN

## 2010-11-23 NOTE — Progress Notes (Signed)
  Subjective:    Patient ID: Sylvia [name], female    DOB: Dec 22, 1960, 50 y.o.   MRN: [id number]  HPI  Very pleasant patient seen in f/u with continued R buttocks pain, occ radiculopathy, and some intermittent tingling in her R LE. She has been functional, not impaired much with working. Pain mostly at R buttocks. Has tried to do rehab, but hurts more the next day. Baseline is a strong, thin patient.  Known L4-5 hernation. Str remains intact Overall, she feels like she is doing better. Flexeril is helping.  Review of Systems REVIEW OF SYSTEMS  GEN: No fevers, chills. Nontoxic. Primarily MSK c/o today. MSK: Detailed in the HPI GI: tolerating PO intake without difficulty Neuro: detailed above Otherwise the pertinent positives of the ROS are noted above.      Objective:   Physical Exam   Physical Exam  There were no vitals taken for this visit.  Gen: Well-developed,well-nourished,in no acute distress; alert,appropriate and cooperative throughout examination HEENT: Normocephalic and atraumatic without obvious abnormalities.  Ears, externally no deformities Pulm: Breathing comfortably in no respiratory distress Range of motion at  the waist: Flexion: normal Extension: normal Lateral bending: normal Rotation: all normal  No echymosis or edema Rises to examination table with no difficulty Gait: non antalgic  Inspection/Deformity: N Paraspinus T: NT  B Ankle Dorsiflexion (L5,4): 5/5 B Great Toe Dorsiflexion (L5,4): 5/5 Rise/Squat (L4): WNL  SENSORY Minimal subj decreased sensation at dorsum of middle toes  B SLR, seated: neg B SLR, supine: neg B FABER: neg B Reverse FABER: neg Notably tender at R piriformis B Greater Troch: NT B Log Roll: neg B Sciatic Notch: NT Leg Lengths: equal       Assessment & Plan:   Back pain Piriformis syndrome  1. SCIATICA, RIGHT   2. Herniated nucleus pulposus, L4-5      Herniated disk on MR But patient localizes all symptoms  to piriformis on the R focally, and I suspect there is a component of this. We will try a piriformis injection to see if this helps the patient. Overall, slowly improving  Injection, Piriformis, R: Verbal consent was obtained. Risks, benefits, and alternatives explained. Sterilely prepped with betadine. Ethyl chloride for anesthesia. Under sterile conditions, 2 1/2 cc of Lidocaine 1% and 1 1/2  cc of Depo-Medrol 40 mg injected directly into R piriformis muscle. No resistance encountered. No complications with procedure and tolerated well. Patient had decreased pain post-injection. 22 gauge 1 1/2 inch needle  Refill flexeril -- TCA like activity

## 2010-11-23 NOTE — Patient Instructions (Signed)
Recheck in 2 months.

## 2011-01-04 ENCOUNTER — Ambulatory Visit (INDEPENDENT_AMBULATORY_CARE_PROVIDER_SITE_OTHER): Payer: Self-pay | Admitting: Family Medicine

## 2011-01-04 ENCOUNTER — Encounter: Payer: Self-pay | Admitting: Family Medicine

## 2011-01-04 DIAGNOSIS — M5430 Sciatica, unspecified side: Secondary | ICD-10-CM

## 2011-01-04 DIAGNOSIS — M5126 Other intervertebral disc displacement, lumbar region: Secondary | ICD-10-CM

## 2011-01-05 NOTE — Progress Notes (Signed)
Subjective:    Patient ID: Sylvia [name], female    DOB: [birth year], 50 y.o.   MRN: [id number]  HPI  Pleasant [place] patient who I remember well who presents with persistent right-sided radiculopathy, buttocks pain, and occasional numbness on her lateral leg and foot as well as the  Distal top portion of her foot. She describes an injury that occurred greater than one year ago while at work.  We have been following her over the last few months after she initially saw her primary care team. She has been on a number of NSAIDs, muscle relaxants, neuropathic pain agents. Currently she is doing well taking some schedule diclofenac 2 times a day,  Tramadol, and Flexeril at nighttime. She has also had 2 Dosepaks of prednisone.  All of her symptoms have been on the RIGHT side.  She has also been to physical therapy, and additionally on her last office visit given her right-sided buttocks pain and persistent radiculopathy, also tried a piriformis injection with corticosteroid, which did not help at all. Initially, many months ago, she has trochanteric bursitis also, and her primary care provider did a trochanteric bursa injection, and she has not had return of those symptoms.  11/23/2010 OV Very pleasant patient seen in f/u with continued R buttocks pain, occ radiculopathy, and some intermittent tingling in her R LE. She has been functional, not impaired much with working. Pain mostly at R buttocks. Has tried to do rehab, but hurts more the next day. Baseline is a strong, thin patient.  Known L4-5 hernation. Str remains intact Overall, she feels like she is doing better. Flexeril is helping.    MRI LUMBAR SPINE WITHOUT CONTRAST   Technique:  Multiplanar and multiecho pulse sequences of the lumbar spine were obtained without intravenous contrast.   Comparison: Radiography 01/19/2010   Findings: There is no abnormality at L3-4 or above.  The discs are unremarkable.  The canal and foramina are widely  patent.  The distal cord and conus are normal.   At L4-5, the disc is degenerated and there is shallow broad-based herniation.  There is stenosis of the lateral recesses that could compress either L5 nerve root.   At L5-S1, the disc is normal.  The canal and foramina are widely patent.   IMPRESSION: Single level pathology at L4-5.  Broad-based disc herniation that results in stenosis of both lateral recesses.  Either or both L5 nerve roots could be compressed.    Patient Active Problem List  Diagnoses  . PULMONARY TUBERCULOSIS  . TOBACCO ABUSE  . DEPRESSION  . GERD  . SCIATICA, RIGHT  . VARICOSE VEINS, LOWER EXTREMITIES  . Herniated nucleus pulposus, L4-5   No past medical history on file. No past surgical history on file. History  Substance Use Topics  . Smoking status: Former Games developer  . Smokeless tobacco: Never Used  . Alcohol Use: Not on file   No family history on file. No Known Allergies Current Outpatient Prescriptions on File Prior to Visit  Medication Sig Dispense Refill  . diclofenac (VOLTAREN) 75 MG EC tablet Take 1 tablet (75 mg total) by mouth 2 (two) times daily.  60 tablet  3  . famotidine (PEPCID) 40 MG tablet Take 40 mg by mouth at bedtime.        Marland Kitchen venlafaxine (EFFEXOR-XR) 75 MG 24 hr capsule Take 75 mg by mouth daily.           Review of Systems REVIEW OF SYSTEMS  GEN: No fevers, chills. Nontoxic.  Primarily MSK c/o today. MSK: Detailed in the HPI GI: tolerating PO intake without difficulty Neuro: detailed above Otherwise the pertinent positives of the ROS are noted above.      Objective:   Physical Exam   Physical Exam  Blood pressure 125/83, pulse 85.  Gen: Well-developed,well-nourished,in no acute distress; alert,appropriate and cooperative throughout examination HEENT: Normocephalic and atraumatic without obvious abnormalities.  Ears, externally no deformities Pulm: Breathing comfortably in no respiratory distress Range of motion at   the waist: Flexion: mild pain Extension: mild pain Rotation: mild pain  No echymosis or edema Rises to examination table with mild difficulty Gait: minimally antalgic  Inspection/Deformity: N Paraspinus T: NT TTP right buttocks, piriformis/quadratus region  B Ankle Dorsiflexion (L5,4): 5/5 B Great Toe Dorsiflexion (L5,4): 5/5 Heel Walk (L5): WNL Toe Walk (S1): WNL Rise/Squat (L4): WNL  SENSORY B Medial Foot (L4): WNL B Dorsum (L5): decreased on R (on foot - normal on leg) B Lateral (S1): decreased on R foot, normal on leg Light Touch: above  REFLEXES Knee (L4): 2+ Ankle (S1): 2+  B SLR, seated: neg B SLR, supine: neg B FABER: neg B Reverse FABER: neg B Greater Troch: NT B Log Roll: neg B Sciatic Notch: mild TTP on R       Assessment & Plan:   1. Herniated nucleus pulposus, L4-5   2. SCIATICA, RIGHT     The patient has had some persistent symptoms, varying degrees of numbness in her lower leg on the RIGHT for some time now and continued radiculopathy despite all forms of conservative management. She is a significant disc bulge at L4-5. I would like to consult Dr. Danielle Dess or one of his associates at [facility] for their opinion on this case.

## 2011-02-08 ENCOUNTER — Encounter: Payer: Self-pay | Admitting: Family Medicine

## 2011-02-15 ENCOUNTER — Ambulatory Visit (INDEPENDENT_AMBULATORY_CARE_PROVIDER_SITE_OTHER): Payer: Worker's Compensation | Admitting: Family Medicine

## 2011-02-15 ENCOUNTER — Encounter: Payer: Self-pay | Admitting: Family Medicine

## 2011-02-15 VITALS — BP 108/74 | HR 76

## 2011-02-15 DIAGNOSIS — M5126 Other intervertebral disc displacement, lumbar region: Secondary | ICD-10-CM

## 2011-02-15 MED ORDER — CYCLOBENZAPRINE HCL 10 MG PO TABS: 10.0000 mg | ORAL_TABLET | Freq: Every day | ORAL | Status: DC

## 2011-02-15 MED ORDER — DICLOFENAC SODIUM 75 MG PO TBEC: 75.0000 mg | DELAYED_RELEASE_TABLET | Freq: Two times a day (BID) | ORAL | Status: DC

## 2011-02-15 MED ORDER — TRAMADOL HCL 50 MG PO TABS: 50.0000 mg | ORAL_TABLET | Freq: Four times a day (QID) | ORAL | Status: DC | PRN

## 2011-02-15 NOTE — Patient Instructions (Addendum)
You still have pain related to your back injury.  I will give you directions to [facility] neurosurgery.  We will send a neurosurgery referral for you and they will call you for an appointment.   We will fax all of todays office notes to [facility] at [phone]. They will be the ones setting up the Neurosurgical appt.

## 2011-02-15 NOTE — Progress Notes (Signed)
  Subjective:    Patient ID: Sylvia [name], female    DOB: 1960-10-04, 50 y.o.   MRN: [id number]  HPI  1. Right radicular leg pain  F/u for persistent right-sided radiculopathy, buttocks pain, and occasional numbness on her lateral leg and foot.   She has been functional, not impaired much with working. Pain mostly at R buttocks. Has tried to do rehab, but hurts more the next day.   Known L4-5 hernation. Str remains intact   MRI LUMBAR SPINE WITHOUT CONTRAST   Technique:  Multiplanar and multiecho pulse sequences of the lumbar spine were obtained without intravenous contrast.   Comparison: Radiography 01/19/2010   Findings: There is no abnormality at L3-4 or above.  The discs are unremarkable.  The canal and foramina are widely patent.  The distal cord and conus are normal.   At L4-5, the disc is degenerated and there is shallow broad-based herniation.  There is stenosis of the lateral recesses that could compress either L5 nerve root.   At L5-S1, the disc is normal.  The canal and foramina are widely patent.   IMPRESSION: Single level pathology at L4-5.  Broad-based disc herniation that results in stenosis of both lateral recesses.  Either or both L5 nerve roots could be compressed.   Original Report Authenticated By: Thomasenia Sales, M.D.   Review of Systems REVIEW OF SYSTEMS  GEN: No fevers, chills. Nontoxic. Primarily MSK c/o today. MSK: Detailed in the HPI GI: tolerating PO intake without difficulty Neuro: detailed above Otherwise the pertinent positives of the ROS are noted above.      Objective:   Physical Exam Filed Vitals:   02/15/11 1516  BP: 108/74  Pulse: 76   Gen: Well-developed,well-nourished,in no acute distress; alert,appropriate and cooperative throughout examination HEENT: Normocephalic and atraumatic without obvious abnormalities.  Ears, externally no deformities Pulm: Breathing comfortably in no respiratory distress Range of motion at  the  waist: Flexion: mild pain Extension: mild pain Rotation: mild pain  Sensory: mildly decreased on the dorsum and lateral foot  Reflex: normoreflexive patellar/ankle.    No echymosis or edema Rises to examination table with mild difficulty  Inspection/Deformity: N Paraspinus T: NT TTP right buttocks, piriformis/quadratus region     Assessment & Plan:   1. Herniated nucleus pulposus, L4-5     Known herniated disk - is fairly functional, but solitary level with persistent pain requiring medication q 2-3 hours for pain, has had 2 dosepaks of steroids, multiple nsaids, physical therapy, a piriformis injection, is compliant with HEP. Also taking Effexor and flexeril with prn tramadol.  The patient is [place], and possibly we misunderstood each other last encounter - I thought she was seeing Neurosurgery. I would like to consult Dr. Danielle Dess with [facility] to get his opinion on this very nice patient.

## 2011-03-27 ENCOUNTER — Encounter: Payer: Self-pay | Admitting: Family Medicine

## 2011-03-27 ENCOUNTER — Ambulatory Visit (INDEPENDENT_AMBULATORY_CARE_PROVIDER_SITE_OTHER): Payer: Worker's Compensation | Admitting: Family Medicine

## 2011-03-27 VITALS — BP 132/86 | HR 69

## 2011-03-27 DIAGNOSIS — M25539 Pain in unspecified wrist: Secondary | ICD-10-CM

## 2011-03-27 DIAGNOSIS — M25531 Pain in right wrist: Secondary | ICD-10-CM | POA: Insufficient documentation

## 2011-03-27 DIAGNOSIS — M5126 Other intervertebral disc displacement, lumbar region: Secondary | ICD-10-CM

## 2011-03-27 MED ORDER — PREDNISONE 20 MG PO TABS: ORAL_TABLET | ORAL | Status: DC

## 2011-03-27 NOTE — Progress Notes (Signed)
Patient Name: Sylvia [name] Date of Birth: 1960/05/03 Age: 51 y.o. Medical Record Number: [id number] Gender: female Date of Encounter: 03/27/2011  History of Present Illness:  Sylvia [name] is a 51 y.o. very pleasant female patient who presents with the following:  Very pleasant patient who I remember well who has been having some right-sided radicular pain as well as buttocks pain and some numbness in her RIGHT lower extremity for a long time. She has a known large L4-5 disc herniation and. Her strength is persisted. Thankfully she is seeing neurosurgery on Thursday. She is here today basically because her symptoms are a little bit worse, and her current medications including Voltaren, Flexeril but not really helping as much right now.  She'll also have some tingling in her fingers that have been ongoing for the last 3 weeks. The RIGHT is greater than the LEFT. No neck pain. No provocation with movement in her neck. She is a Child psychotherapist.  02/15/2011 OV 1. Right radicular leg pain  F/u for persistent right-sided radiculopathy, buttocks pain, and occasional numbness on her lateral leg and foot.   She has been functional, not impaired much with working. Pain mostly at R buttocks. Has tried to do rehab, but hurts more the next day.   Known L4-5 hernation. Str remains intact   MRI LUMBAR SPINE WITHOUT CONTRAST   Technique:  Multiplanar and multiecho pulse sequences of the lumbar spine were obtained without intravenous contrast.   Comparison: Radiography 01/19/2010   Findings: There is no abnormality at L3-4 or above.  The discs are unremarkable.  The canal and foramina are widely patent.  The distal cord and conus are normal.   At L4-5, the disc is degenerated and there is shallow broad-based herniation.  There is stenosis of the lateral recesses that could compress either L5 nerve root.   At L5-S1, the disc is normal.  The canal and foramina are widely patent.   IMPRESSION: Single level  pathology at L4-5.  Broad-based disc herniation that results in stenosis of both lateral recesses.  Either or both L5 nerve roots could be compressed.   Original Report Authenticated By: Thomasenia Sales, M.D.  Past Medical History, Surgical History, Social History, Family History, Problem List, Medications, and Allergies have been reviewed and updated if relevant.  Review of Systems:  GEN: No fevers, chills. Nontoxic. Primarily MSK c/o today. MSK: Detailed in the HPI GI: tolerating PO intake without difficulty Neuro: detailed above Otherwise the pertinent positives of the ROS are noted above.   Physical Examination: Filed Vitals:   03/27/11 1528  BP: 132/86  Pulse: 69    There is no height or weight on file to calculate BMI.   GEN: WDWN, NAD, Non-toxic, Alert & Oriented x 3 HEENT: Atraumatic, Normocephalic.  Ears and Nose: No external deformity. EXTR: No clubbing/cyanosis/edema NEURO: Normal gait.  PSYCH: Normally interactive. Conversant. Not depressed or anxious appearing.  Calm demeanor.   Lumbar spine: Nontender in the spinous processes and erector spinae region. Nontender at the SI joints. Nontender at the greater trochanteric bursa. Full range of motion in all directions. Negative straight leg raise. Buttocks pain, point tenderness on the RIGHT. Region of piriformis and quadratus.  Decreased sensation in the distal lower extremity on the RIGHT.  Hand and wrist: Full range of motion. Nontender throughout bony anatomy. Tinel's is negative. phalen's pos on R  Assessment and Plan: 1. Wrist pain, right   2. Herniated nucleus pulposus, L4-5     Suspect some carpal tunnel  syndrome, RIGHT greater than LEFT. Start with cockup wrist splint at nighttime.  I appreciate neurosurgical opinion. The patient does have a large L4-5 herniation. Symptoms are slightly worse and today. I'm going to give her a pulse of prednisone oral steroids 40 mg for a week then 20 mg for a week.

## 2011-05-01 ENCOUNTER — Other Ambulatory Visit: Payer: Self-pay | Admitting: *Deleted

## 2011-05-01 ENCOUNTER — Other Ambulatory Visit: Payer: Self-pay | Admitting: Family Medicine

## 2011-05-01 MED ORDER — TRAMADOL HCL 50 MG PO TABS: 50.0000 mg | ORAL_TABLET | Freq: Four times a day (QID) | ORAL | Status: DC | PRN

## 2011-05-01 NOTE — Telephone Encounter (Signed)
Is this a patient here?

## 2011-05-01 NOTE — Telephone Encounter (Signed)
No, i already took care of it. smc pt.

## 2011-05-31 ENCOUNTER — Other Ambulatory Visit: Payer: Self-pay | Admitting: Neurosurgery

## 2011-06-07 ENCOUNTER — Encounter (HOSPITAL_COMMUNITY): Payer: Self-pay

## 2011-06-14 ENCOUNTER — Encounter (HOSPITAL_COMMUNITY)
Admission: RE | Admit: 2011-06-14 | Discharge: 2011-06-14 | Disposition: A | Discharge status: Home / Self Care | Payer: Worker's Compensation | Source: Ambulatory Visit | Attending: Neurosurgery | Admitting: Neurosurgery

## 2011-06-14 ENCOUNTER — Encounter (HOSPITAL_COMMUNITY): Payer: Self-pay

## 2011-06-14 HISTORY — DX: Dorsalgia, unspecified: M54.9

## 2011-06-14 HISTORY — DX: Headache: R51

## 2011-06-14 LAB — SURGICAL PCR SCREEN: MRSA, PCR: POSITIVE — AB

## 2011-06-14 LAB — CBC
Hemoglobin: 11.3 g/dL — ABNORMAL LOW (ref 12.0–15.0)
MCH: 30.5 pg (ref 26.0–34.0)
Platelets: 257 10*3/uL (ref 150–400)
RBC: 3.71 MIL/uL — ABNORMAL LOW (ref 3.87–5.11)

## 2011-06-14 NOTE — Pre-Procedure Instructions (Signed)
20 Sylvia [name]  06/14/2011   Your procedure is scheduled on:  April 10  Report to [facility] at 0630 AM.  Call this number if you have problems the morning of surgery: [phone]   Remember:   Do not eat food:After Midnight.  May have clear liquids: up to 4 Hours before arrival.  Clear liquids include soda, tea, black coffee, apple or grape juice, broth.  Take these medicines the morning of surgery with A SIP OF WATER: EFFEXOR,TRAMADOL  Do not wear jewelry, make-up or nail polish.  Do not wear lotions, powders, or perfumes. You may wear deodorant.  Do not shave 48 hours prior to surgery.  Do not bring valuables to the hospital.  Contacts, dentures or bridgework may not be worn into surgery.  Leave suitcase in the car. After surgery it may be brought to your room.  For patients admitted to the hospital, checkout time is 11:00 AM the day of discharge.   Patients discharged the day of surgery will not be allowed to drive home.  Name and phone number of your driver: SISTER  Special Instructions: CHG Shower Use Special Wash: 1/2 bottle night before surgery and 1/2 bottle morning of surgery.   Please read over the following fact sheets that you were given: MRSA Information and Surgical Site Infection Prevention

## 2011-06-19 MED ORDER — CEFAZOLIN SODIUM 1-5 GM-% IV SOLN
1.0000 g | INTRAVENOUS | Status: AC
  Administered 2011-06-20: 1 g via INTRAVENOUS
  Filled 2011-06-19: qty 50

## 2011-06-20 ENCOUNTER — Ambulatory Visit (HOSPITAL_COMMUNITY): Payer: Worker's Compensation

## 2011-06-20 ENCOUNTER — Encounter (HOSPITAL_COMMUNITY): Payer: Self-pay | Admitting: *Deleted

## 2011-06-20 ENCOUNTER — Encounter (HOSPITAL_COMMUNITY): Payer: Self-pay | Admitting: Anesthesiology

## 2011-06-20 ENCOUNTER — Inpatient Hospital Stay (HOSPITAL_COMMUNITY): Payer: Worker's Compensation

## 2011-06-20 ENCOUNTER — Encounter (HOSPITAL_COMMUNITY)
Admission: RE | Disposition: A | Discharge status: Home / Self Care | Payer: Self-pay | Source: Ambulatory Visit | Attending: Neurosurgery

## 2011-06-20 ENCOUNTER — Inpatient Hospital Stay (HOSPITAL_COMMUNITY)
Admission: RE | Admit: 2011-06-20 | Discharge: 2011-06-20 | DRG: 491 | Disposition: A | Discharge status: Home / Self Care | Payer: Worker's Compensation | Source: Ambulatory Visit | Attending: Neurosurgery | Admitting: Neurosurgery

## 2011-06-20 ENCOUNTER — Ambulatory Visit (HOSPITAL_COMMUNITY): Payer: Worker's Compensation | Admitting: Anesthesiology

## 2011-06-20 DIAGNOSIS — K219 Gastro-esophageal reflux disease without esophagitis: Secondary | ICD-10-CM | POA: Diagnosis present

## 2011-06-20 DIAGNOSIS — Z01812 Encounter for preprocedural laboratory examination: Secondary | ICD-10-CM

## 2011-06-20 DIAGNOSIS — M47817 Spondylosis without myelopathy or radiculopathy, lumbosacral region: Secondary | ICD-10-CM | POA: Diagnosis present

## 2011-06-20 DIAGNOSIS — M5126 Other intervertebral disc displacement, lumbar region: Principal | ICD-10-CM | POA: Diagnosis present

## 2011-06-20 HISTORY — PX: LUMBAR LAMINECTOMY/DECOMPRESSION MICRODISCECTOMY: SHX5026

## 2011-06-20 SURGERY — LUMBAR LAMINECTOMY/DECOMPRESSION MICRODISCECTOMY 1 LEVEL
Anesthesia: General | Site: Back | Laterality: Right | Wound class: Clean

## 2011-06-20 MED ORDER — DIAZEPAM 5 MG PO TABS: 5.0000 mg | ORAL_TABLET | Freq: Four times a day (QID) | ORAL | Status: DC | PRN

## 2011-06-20 MED ORDER — BUPIVACAINE-EPINEPHRINE PF 0.5-1:200000 % IJ SOLN
INTRAMUSCULAR | Status: DC | PRN
  Administered 2011-06-20: 20 mL

## 2011-06-20 MED ORDER — MIDAZOLAM HCL 5 MG/5ML IJ SOLN
INTRAMUSCULAR | Status: DC | PRN
  Administered 2011-06-20 (×2): 1 mg via INTRAVENOUS

## 2011-06-20 MED ORDER — PHENOL 1.4 % MT LIQD: 1.0000 | OROMUCOSAL | Status: DC | PRN

## 2011-06-20 MED ORDER — SUFENTANIL CITRATE 50 MCG/ML IV SOLN
INTRAVENOUS | Status: DC | PRN
  Administered 2011-06-20: 2.5 ug via INTRAVENOUS
  Administered 2011-06-20: 15 ug via INTRAVENOUS

## 2011-06-20 MED ORDER — MEPERIDINE HCL 25 MG/ML IJ SOLN: 6.2500 mg | INTRAMUSCULAR | Status: DC | PRN

## 2011-06-20 MED ORDER — MIDAZOLAM HCL 2 MG/2ML IJ SOLN: 0.5000 mg | Freq: Once | INTRAMUSCULAR | Status: DC | PRN

## 2011-06-20 MED ORDER — METHYLPREDNISOLONE ACETATE 80 MG/ML IJ SUSP
INTRAMUSCULAR | Status: DC | PRN
  Administered 2011-06-20: 80 mg
  Administered 2011-06-20: 80 mg via INTRA_ARTICULAR

## 2011-06-20 MED ORDER — PHENYLEPHRINE HCL 10 MG/ML IJ SOLN
INTRAMUSCULAR | Status: DC | PRN
  Administered 2011-06-20: 40 ug via INTRAVENOUS

## 2011-06-20 MED ORDER — MUPIROCIN 2 % EX OINT
TOPICAL_OINTMENT | CUTANEOUS | Status: AC
  Filled 2011-06-20: qty 22

## 2011-06-20 MED ORDER — VENLAFAXINE HCL ER 75 MG PO CP24
75.0000 mg | ORAL_CAPSULE | Freq: Every day | ORAL | Status: DC
  Filled 2011-06-20: qty 1

## 2011-06-20 MED ORDER — FENTANYL CITRATE 0.05 MG/ML IJ SOLN
INTRAMUSCULAR | Status: AC
  Administered 2011-06-20: 50 ug via INTRAVENOUS
  Filled 2011-06-20: qty 2

## 2011-06-20 MED ORDER — ONDANSETRON HCL 4 MG/2ML IJ SOLN
INTRAMUSCULAR | Status: DC | PRN
  Administered 2011-06-20: 4 mg via INTRAVENOUS

## 2011-06-20 MED ORDER — SODIUM CHLORIDE 0.9 % IJ SOLN: 3.0000 mL | Freq: Two times a day (BID) | INTRAMUSCULAR | Status: DC

## 2011-06-20 MED ORDER — HEMOSTATIC AGENTS (NO CHARGE) OPTIME
TOPICAL | Status: DC | PRN
  Administered 2011-06-20: 1 via TOPICAL

## 2011-06-20 MED ORDER — PROMETHAZINE HCL 25 MG/ML IJ SOLN: 6.2500 mg | INTRAMUSCULAR | Status: DC | PRN

## 2011-06-20 MED ORDER — ACETAMINOPHEN 650 MG RE SUPP: 650.0000 mg | RECTAL | Status: DC | PRN

## 2011-06-20 MED ORDER — THROMBIN 5000 UNITS EX KIT
PACK | CUTANEOUS | Status: DC | PRN
  Administered 2011-06-20 (×2): 5000 [IU] via TOPICAL

## 2011-06-20 MED ORDER — FENTANYL CITRATE 0.05 MG/ML IJ SOLN
INTRAMUSCULAR | Status: AC
  Filled 2011-06-20: qty 2

## 2011-06-20 MED ORDER — NEOSTIGMINE METHYLSULFATE 1 MG/ML IJ SOLN
INTRAMUSCULAR | Status: DC | PRN
  Administered 2011-06-20: 3 mg via INTRAVENOUS

## 2011-06-20 MED ORDER — ONDANSETRON HCL 4 MG/2ML IJ SOLN: 4.0000 mg | INTRAMUSCULAR | Status: DC | PRN

## 2011-06-20 MED ORDER — MORPHINE SULFATE 4 MG/ML IJ SOLN: 2.0000 mg | INTRAMUSCULAR | Status: DC | PRN

## 2011-06-20 MED ORDER — CEFAZOLIN SODIUM 1-5 GM-% IV SOLN
1.0000 g | Freq: Three times a day (TID) | INTRAVENOUS | Status: DC
  Filled 2011-06-20 (×2): qty 50

## 2011-06-20 MED ORDER — SODIUM CHLORIDE 0.9 % IV SOLN: INTRAVENOUS | Status: DC

## 2011-06-20 MED ORDER — LACTATED RINGERS IV SOLN
INTRAVENOUS | Status: DC | PRN
  Administered 2011-06-20 (×2): via INTRAVENOUS

## 2011-06-20 MED ORDER — FENTANYL CITRATE 0.05 MG/ML IJ SOLN
25.0000 ug | INTRAMUSCULAR | Status: DC | PRN
  Administered 2011-06-20 (×3): 50 ug via INTRAVENOUS
  Filled 2011-06-20: qty 2

## 2011-06-20 MED ORDER — ROCURONIUM BROMIDE 100 MG/10ML IV SOLN
INTRAVENOUS | Status: DC | PRN
  Administered 2011-06-20: 40 mg via INTRAVENOUS

## 2011-06-20 MED ORDER — MUPIROCIN 2 % EX OINT
TOPICAL_OINTMENT | Freq: Two times a day (BID) | CUTANEOUS | Status: DC
  Administered 2011-06-20: 1 via NASAL
  Filled 2011-06-20: qty 22

## 2011-06-20 MED ORDER — ACETAMINOPHEN 325 MG PO TABS: 325.0000 mg | ORAL_TABLET | ORAL | Status: DC | PRN

## 2011-06-20 MED ORDER — ACETAMINOPHEN 325 MG PO TABS: 650.0000 mg | ORAL_TABLET | ORAL | Status: DC | PRN

## 2011-06-20 MED ORDER — SODIUM CHLORIDE 0.9 % IJ SOLN: 3.0000 mL | INTRAMUSCULAR | Status: DC | PRN

## 2011-06-20 MED ORDER — MENTHOL 3 MG MT LOZG: 1.0000 | LOZENGE | OROMUCOSAL | Status: DC | PRN

## 2011-06-20 MED ORDER — 0.9 % SODIUM CHLORIDE (POUR BTL) OPTIME
TOPICAL | Status: DC | PRN
  Administered 2011-06-20: 1000 mL

## 2011-06-20 MED ORDER — GLYCOPYRROLATE 0.2 MG/ML IJ SOLN
INTRAMUSCULAR | Status: DC | PRN
  Administered 2011-06-20: .4 mg via INTRAVENOUS

## 2011-06-20 MED ORDER — OXYCODONE-ACETAMINOPHEN 5-325 MG PO TABS: 1.0000 | ORAL_TABLET | ORAL | Status: DC | PRN

## 2011-06-20 MED ORDER — PROPOFOL 10 MG/ML IV EMUL
INTRAVENOUS | Status: DC | PRN
  Administered 2011-06-20: 120 mg via INTRAVENOUS

## 2011-06-20 SURGICAL SUPPLY — 60 items
APL SKNCLS STERI-STRIP NONHPOA (GAUZE/BANDAGES/DRESSINGS) ×1
BENZOIN TINCTURE PRP APPL 2/3 (GAUZE/BANDAGES/DRESSINGS) ×2 IMPLANT
BLADE SURG ROTATE 9660 (MISCELLANEOUS) IMPLANT
BUR ACORN 6.0 (BURR) ×1 IMPLANT
BUR MATCHSTICK NEURO 3.0 LAGG (BURR) ×2 IMPLANT
CANISTER SUCTION 2500CC (MISCELLANEOUS) ×2 IMPLANT
CLOTH BEACON ORANGE TIMEOUT ST (SAFETY) ×2 IMPLANT
CONT SPEC 4OZ CLIKSEAL STRL BL (MISCELLANEOUS) ×2 IMPLANT
DRAPE LAPAROTOMY 100X72X124 (DRAPES) ×2 IMPLANT
DRAPE MICROSCOPE LEICA (MISCELLANEOUS) ×2 IMPLANT
DRAPE POUCH INSTRU U-SHP 10X18 (DRAPES) ×2 IMPLANT
DRSG PAD ABDOMINAL 8X10 ST (GAUZE/BANDAGES/DRESSINGS) IMPLANT
DURAPREP 26ML APPLICATOR (WOUND CARE) ×2 IMPLANT
ELECT REM PT RETURN 9FT ADLT (ELECTROSURGICAL) ×2
ELECTRODE REM PT RTRN 9FT ADLT (ELECTROSURGICAL) ×1 IMPLANT
GAUZE SPONGE 4X4 16PLY XRAY LF (GAUZE/BANDAGES/DRESSINGS) IMPLANT
GLOVE BIOGEL M 8.0 STRL (GLOVE) ×2 IMPLANT
GLOVE BIOGEL PI IND STRL 7.0 (GLOVE) IMPLANT
GLOVE BIOGEL PI IND STRL 8 (GLOVE) IMPLANT
GLOVE BIOGEL PI INDICATOR 7.0 (GLOVE) ×1
GLOVE BIOGEL PI INDICATOR 8 (GLOVE) ×1
GLOVE ECLIPSE 6.5 STRL STRAW (GLOVE) ×1 IMPLANT
GLOVE EXAM NITRILE LRG STRL (GLOVE) IMPLANT
GLOVE EXAM NITRILE MD LF STRL (GLOVE) IMPLANT
GLOVE EXAM NITRILE XL STR (GLOVE) IMPLANT
GLOVE EXAM NITRILE XS STR PU (GLOVE) IMPLANT
GLOVE OPTIFIT SS 6.5 STRL BRWN (GLOVE) ×2 IMPLANT
GOWN BRE IMP SLV AUR LG STRL (GOWN DISPOSABLE) ×3 IMPLANT
GOWN BRE IMP SLV AUR XL STRL (GOWN DISPOSABLE) ×1 IMPLANT
GOWN STRL REIN 2XL LVL4 (GOWN DISPOSABLE) IMPLANT
KIT BASIN OR (CUSTOM PROCEDURE TRAY) ×2 IMPLANT
KIT ROOM TURNOVER OR (KITS) ×2 IMPLANT
NDL HYPO 18GX1.5 BLUNT FILL (NEEDLE) IMPLANT
NDL HYPO 21X1.5 SAFETY (NEEDLE) IMPLANT
NDL HYPO 25X1 1.5 SAFETY (NEEDLE) IMPLANT
NDL SPNL 20GX3.5 QUINCKE YW (NEEDLE) IMPLANT
NEEDLE HYPO 18GX1.5 BLUNT FILL (NEEDLE) IMPLANT
NEEDLE HYPO 21X1.5 SAFETY (NEEDLE) IMPLANT
NEEDLE HYPO 25X1 1.5 SAFETY (NEEDLE) IMPLANT
NEEDLE SPNL 20GX3.5 QUINCKE YW (NEEDLE) IMPLANT
NS IRRIG 1000ML POUR BTL (IV SOLUTION) ×2 IMPLANT
PACK LAMINECTOMY NEURO (CUSTOM PROCEDURE TRAY) ×2 IMPLANT
PAD ARMBOARD 7.5X6 YLW CONV (MISCELLANEOUS) ×6 IMPLANT
PATTIES SURGICAL .5 X1 (DISPOSABLE) ×2 IMPLANT
RUBBERBAND STERILE (MISCELLANEOUS) ×4 IMPLANT
SPONGE GAUZE 4X4 12PLY (GAUZE/BANDAGES/DRESSINGS) ×2 IMPLANT
SPONGE LAP 4X18 X RAY DECT (DISPOSABLE) IMPLANT
SPONGE SURGIFOAM ABS GEL SZ50 (HEMOSTASIS) ×2 IMPLANT
STRIP CLOSURE SKIN 1/2X4 (GAUZE/BANDAGES/DRESSINGS) ×2 IMPLANT
SUT VIC AB 0 CT1 18XCR BRD8 (SUTURE) ×1 IMPLANT
SUT VIC AB 0 CT1 8-18 (SUTURE) ×2
SUT VIC AB 2-0 CP2 18 (SUTURE) ×2 IMPLANT
SUT VIC AB 3-0 SH 8-18 (SUTURE) ×2 IMPLANT
SYR 20CC LL (SYRINGE) IMPLANT
SYR 20ML ECCENTRIC (SYRINGE) ×2 IMPLANT
SYR 5ML LL (SYRINGE) IMPLANT
TAPE CLOTH SURG 4X10 WHT LF (GAUZE/BANDAGES/DRESSINGS) ×1 IMPLANT
TOWEL OR 17X24 6PK STRL BLUE (TOWEL DISPOSABLE) ×2 IMPLANT
TOWEL OR 17X26 10 PK STRL BLUE (TOWEL DISPOSABLE) ×2 IMPLANT
WATER STERILE IRR 1000ML POUR (IV SOLUTION) ×2 IMPLANT

## 2011-06-20 NOTE — Transfer of Care (Signed)
Immediate Anesthesia Transfer of Care Note  Patient: Sylvia [name]  Procedure(s) Performed: Procedure(s) (LRB): LUMBAR LAMINECTOMY/DECOMPRESSION MICRODISCECTOMY 1 LEVEL (Right)  Patient Location: PACU  Anesthesia Type: General  Level of Consciousness: awake and sedated  Airway & Oxygen Therapy: Patient Spontanous Breathing and Patient connected to nasal cannula oxygen  Post-op Assessment: Report given to PACU RN, Post -op Vital signs reviewed and stable and Patient moving all extremities  Post vital signs: Reviewed and stable  Complications: No apparent anesthesia complications

## 2011-06-20 NOTE — Progress Notes (Signed)
Right l45 discectomy done. Op note [phone]

## 2011-06-20 NOTE — Discharge Summary (Signed)
Physician Discharge Summary  Patient ID: Sylvia [name] MRN: [id number] DOB/AGE: 1961/01/01 51 y.o.  Admit date: 06/20/2011 Discharge date: 06/20/2011  Admission Diagnoses:right l45 hnp  Discharge Diagnoses: same   Discharged Condition: no pain  Hospital Course: surgery this am  Consults: none  Significant Diagnostic Studies: mri  Treatments: surgery  Discharge Exam: Blood pressure 150/90, pulse 79, temperature 97.5 F (36.4 C), temperature source Oral, resp. rate 18, SpO2 97.00%. No weakness  Disposition: home   Medication List  As of 06/20/2011  3:02 PM   ASK your doctor about these medications         diclofenac 75 MG EC tablet   Commonly known as: VOLTAREN   Take 75 mg by mouth 2 (two) times daily.      traMADol 50 MG tablet   Commonly known as: ULTRAM   Take 50 mg by mouth every 6 (six) hours as needed. For pain      venlafaxine 75 MG 24 hr capsule   Commonly known as: EFFEXOR-XR   Take 75 mg by mouth daily.             Signed: Karn Cassis 06/20/2011, 3:02 PM

## 2011-06-20 NOTE — Op Note (Signed)
NAMEADYSEN, Sylvia [name] NO.:  [id number]  MEDICAL RECORD NO.:  [id number]  LOCATION:  [facility]                         FACILITY:  [facility]  PHYSICIAN:  Hilda Lias, M.D.   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  PROCEDURE:  Right L4-5 laminotomy, foraminotomy, right L4-5 diskectomy, decompression of the L4 and L5 nerve root.  Microscope.  SURGEON:  Hilda Lias, MD  ASSISTANT:  Coletta Memos, MD  CLINICAL HISTORY:  The patient fell at work almost 2 years ago.  Since then, she had been complaining of pain down to the right leg.  She had failed with conservative treatment.  X-ray shows stenosis with facet arthropathy and herniated disk at L4-5.  Surgery was advised in view of no improvement.  The patient knew the risk of the surgery as no improvement, CSF leak, infection, need of further surgery.  PROCEDURE:  The patient was taken to the OR and after intubation, she was positioned in a prone manner.  The back was cleaned with DuraPrep. Drapes were applied.  Midline incision was made between L4-5 and muscle was retracted on the right side.  X-ray showed that indeed we were at L4- 5.  With the help of the microscope, we drilled the lower lamina of L4 and the upper of L5 and part of the one third of facet.  The yellow ligament was also excised.  Immediately, we found that the L5 nerve root was displaced medially and was really attached to the floor.  Lysis was accomplished.  Retraction of the thecal sac and the L5 nerve root was done and the patient had a quite a bit of large herniated disk. Incision was made and large amount of degenerative disk medial and lateral  were removed.  At the end, we had a good decompression of the thecal sac, the L5 and L4 nerve root.  Valsalva maneuver was negative. Then, Depo-Medrol and fentanyl were left in the epidural space and the wound was closed with Vicryl and Steri-Strips.          ______________________________ Hilda Lias, M.D.     EB/MEDQ  D:  06/20/2011  T:  06/20/2011  Job:  [id number]

## 2011-06-20 NOTE — H&P (Signed)
Sylvia [name] is an 51 y.o. female.   Chief Complaint: *right leg pain HPI: fell while at work on 10/19/09.since then she has been complaining of lumbar pain with radiation to right leg.no pain in left leg. No better with medications.   Past Medical History  Diagnosis Date  . Back pain   . Headache     Past Surgical History  Procedure Date  . Appendectomy     History reviewed. No pertinent family history. Social History:  reports that she has quit smoking. She has never used smokeless tobacco. She reports that she does not drink alcohol or use illicit drugs.  Allergies: No Known Allergies  Medications Prior to Admission  Medication Dose Route Frequency Provider Last Rate Last Dose  . ceFAZolin (ANCEF) IVPB 1 g/50 mL premix  1 g Intravenous 60 min Pre-Op Karn Cassis, MD      . mupirocin ointment (BACTROBAN) 2 %           . mupirocin ointment (BACTROBAN) 2 %   Nasal BID Karn Cassis, MD   1 application at 06/20/11 [id number]   Medications Prior to Admission  Medication Sig Dispense Refill  . traMADol (ULTRAM) 50 MG tablet Take 50 mg by mouth every 6 (six) hours as needed. For pain      . venlafaxine (EFFEXOR-XR) 75 MG 24 hr capsule Take 75 mg by mouth daily.       . diclofenac (VOLTAREN) 75 MG EC tablet Take 75 mg by mouth 2 (two) times daily.        No results found for this or any previous visit (from the past 48 hour(s)). No results found.  Review of Systems  Constitutional: Negative.   HENT: Negative.   Eyes: Negative.   Respiratory: Negative.   Cardiovascular: Negative.   Gastrointestinal: Negative.   Genitourinary: Negative.   Musculoskeletal: Positive for back pain.  Skin: Negative.   Neurological: Negative.   Endo/Heme/Allergies: Negative.   Psychiatric/Behavioral: Negative.     Blood pressure 132/82, pulse 70, temperature 97.9 F (36.6 C), temperature source Oral, resp. rate 18, SpO2 97.00%. Physical Exam hent, nl. Neck, nl. Cv, nl. Lungs, nl. Abdomen, nl.  Extremities,nl. NEURO weakness of df in right foot. slr positive in right side with positive sciatic . Mri showed facet arthropathy with latera recess stenosis at right 4-5  Assessment/Plan Right l45  Laminotomy and foraminotomy. Patient aware of risks and outcome.  Jelitza Manninen M 06/20/2011, 8:29 AM

## 2011-06-20 NOTE — Discharge Instructions (Signed)
Wound Care °Leave incision open to air. °You may shower. °Do not scrub directly on incision.  °Leave steri-strips on incision.  They will fall off by themselves. °Do not put any creams, lotions, or ointments on incision. °Activity °Walk each and every day, increasing distance each day. °No lifting greater than 5 lbs.  Avoid bending, arching, and twisting. °No driving for 2 weeks; may ride as a passenger locally. °If provided with back brace, wear when out of bed.  It is not necessary to wear in bed. °Diet °Resume your normal diet.  °Return to Work °Will be discussed at you follow up appointment. °Call Your Doctor If Any of These Occur °Redness, drainage, or swelling at the wound.  °Temperature greater than 101 degrees. °Severe pain not relieved by pain medication. °Incision starts to come apart. °Follow Up Appt °Call today for appointment in 3-4 weeks ([phone]) or for problems.  If you have any hardware placed in your spine, you will need an x-ray before your appointment. ° °

## 2011-06-20 NOTE — Anesthesia Postprocedure Evaluation (Signed)
Anesthesia Post Note  Patient: Sylvia [name]  Procedure(s) Performed: Procedure(s) (LRB): LUMBAR LAMINECTOMY/DECOMPRESSION MICRODISCECTOMY 1 LEVEL (Right)  Anesthesia type: GA  Patient location: PACU  Post pain: Pain level controlled  Post assessment: Post-op Vital signs reviewed  Last Vitals:  Filed Vitals:   06/20/11 1050  BP: 132/79  Pulse: 80  Temp:   Resp: 11    Post vital signs: Reviewed  Level of consciousness: sedated  Complications: No apparent anesthesia complications

## 2011-06-20 NOTE — Anesthesia Preprocedure Evaluation (Signed)
Anesthesia Evaluation  Patient identified by MRN, date of birth, ID band Patient awake    Reviewed: Allergy & Precautions, H&P , Patient's Chart, lab work & pertinent test results, reviewed documented beta blocker date and time   History of Anesthesia Complications Negative for: history of anesthetic complications  Airway Mallampati: II TM Distance: >3 FB Neck ROM: full    Dental No notable dental hx.    Pulmonary neg pulmonary ROS,  breath sounds clear to auscultation  Pulmonary exam normal       Cardiovascular Exercise Tolerance: Good negative cardio ROS  Rhythm:regular Rate:Normal     Neuro/Psych negative neurological ROS  negative psych ROS   GI/Hepatic negative GI ROS, Neg liver ROS, GERD-  ,  Endo/Other  negative endocrine ROS  Renal/GU negative Renal ROS     Musculoskeletal   Abdominal   Peds  Hematology negative hematology ROS (+)   Anesthesia Other Findings   Reproductive/Obstetrics negative OB ROS                           Anesthesia Physical Anesthesia Plan  ASA: II  Anesthesia Plan: General ETT   Post-op Pain Management:    Induction:   Airway Management Planned:   Additional Equipment:   Intra-op Plan:   Post-operative Plan:   Informed Consent: I have reviewed the patients History and Physical, chart, labs and discussed the procedure including the risks, benefits and alternatives for the proposed anesthesia with the patient or authorized representative who has indicated his/her understanding and acceptance.   Dental Advisory Given  Plan Discussed with: CRNA and Surgeon  Anesthesia Plan Comments:         Anesthesia Quick Evaluation

## 2011-06-21 ENCOUNTER — Encounter (HOSPITAL_COMMUNITY): Payer: Self-pay | Admitting: Neurosurgery

## 2011-12-31 ENCOUNTER — Other Ambulatory Visit: Payer: Self-pay | Admitting: Neurosurgery

## 2011-12-31 DIAGNOSIS — M549 Dorsalgia, unspecified: Secondary | ICD-10-CM

## 2011-12-31 DIAGNOSIS — M79604 Pain in right leg: Secondary | ICD-10-CM

## 2012-01-02 ENCOUNTER — Ambulatory Visit
Admission: RE | Admit: 2012-01-02 | Discharge: 2012-01-02 | Disposition: A | Discharge status: Home / Self Care | Payer: Worker's Compensation | Source: Ambulatory Visit | Attending: Neurosurgery | Admitting: Neurosurgery

## 2012-01-02 DIAGNOSIS — M79604 Pain in right leg: Secondary | ICD-10-CM

## 2012-01-02 DIAGNOSIS — M549 Dorsalgia, unspecified: Secondary | ICD-10-CM

## 2012-01-02 MED ORDER — IOHEXOL 180 MG/ML  SOLN
1.0000 mL | Freq: Once | INTRAMUSCULAR | Status: AC | PRN
  Administered 2012-01-02: 1 mL via EPIDURAL

## 2012-02-22 ENCOUNTER — Other Ambulatory Visit: Payer: Self-pay | Admitting: Neurosurgery

## 2012-02-22 DIAGNOSIS — M549 Dorsalgia, unspecified: Secondary | ICD-10-CM

## 2012-02-26 ENCOUNTER — Ambulatory Visit
Admission: RE | Admit: 2012-02-26 | Discharge: 2012-02-26 | Disposition: A | Discharge status: Home / Self Care | Payer: Worker's Compensation | Source: Ambulatory Visit | Attending: Neurosurgery | Admitting: Neurosurgery

## 2012-02-26 VITALS — BP 114/64 | HR 73

## 2012-02-26 DIAGNOSIS — M549 Dorsalgia, unspecified: Secondary | ICD-10-CM

## 2012-02-26 MED ORDER — IOHEXOL 180 MG/ML  SOLN
1.0000 mL | Freq: Once | INTRAMUSCULAR | Status: AC | PRN
  Administered 2012-02-26: 1 mL via EPIDURAL

## 2012-02-26 MED ORDER — METHYLPREDNISOLONE ACETATE 40 MG/ML INJ SUSP (RADIOLOG
120.0000 mg | Freq: Once | INTRAMUSCULAR | Status: AC
  Administered 2012-02-26: 120 mg via EPIDURAL

## 2012-04-08 ENCOUNTER — Ambulatory Visit: Payer: Self-pay

## 2012-04-18 ENCOUNTER — Other Ambulatory Visit (HOSPITAL_COMMUNITY): Payer: Self-pay | Admitting: Neurosurgery

## 2012-04-18 DIAGNOSIS — M5430 Sciatica, unspecified side: Secondary | ICD-10-CM

## 2012-04-22 ENCOUNTER — Other Ambulatory Visit: Payer: Self-pay | Admitting: Radiology

## 2012-04-23 ENCOUNTER — Ambulatory Visit (HOSPITAL_COMMUNITY): Payer: Worker's Compensation

## 2012-04-23 ENCOUNTER — Other Ambulatory Visit (HOSPITAL_COMMUNITY): Payer: Worker's Compensation

## 2013-01-27 ENCOUNTER — Encounter (HOSPITAL_COMMUNITY): Payer: Self-pay | Admitting: Pharmacy Technician

## 2013-01-27 ENCOUNTER — Other Ambulatory Visit: Payer: Self-pay | Admitting: Neurosurgery

## 2013-01-30 ENCOUNTER — Encounter (HOSPITAL_COMMUNITY)
Admission: RE | Admit: 2013-01-30 | Discharge: 2013-01-30 | Disposition: A | Discharge status: Home / Self Care | Payer: Worker's Compensation | Source: Ambulatory Visit | Attending: Neurosurgery | Admitting: Neurosurgery

## 2013-01-30 ENCOUNTER — Encounter (HOSPITAL_COMMUNITY): Payer: Self-pay

## 2013-01-30 DIAGNOSIS — Z01812 Encounter for preprocedural laboratory examination: Secondary | ICD-10-CM | POA: Insufficient documentation

## 2013-01-30 DIAGNOSIS — Z01818 Encounter for other preprocedural examination: Secondary | ICD-10-CM | POA: Insufficient documentation

## 2013-01-30 HISTORY — DX: Adverse effect of unspecified anesthetic, initial encounter: T41.45XA

## 2013-01-30 HISTORY — DX: Unspecified osteoarthritis, unspecified site: M19.90

## 2013-01-30 HISTORY — DX: Other complications of anesthesia, initial encounter: T88.59XA

## 2013-01-30 LAB — CBC
Hemoglobin: 11.4 g/dL — ABNORMAL LOW (ref 12.0–15.0)
MCH: 31.3 pg (ref 26.0–34.0)
MCHC: 34.8 g/dL (ref 30.0–36.0)
Platelets: 247 10*3/uL (ref 150–400)
RBC: 3.64 MIL/uL — ABNORMAL LOW (ref 3.87–5.11)

## 2013-01-30 LAB — BASIC METABOLIC PANEL
BUN: 11 mg/dL (ref 6–23)
Calcium: 9.6 mg/dL (ref 8.4–10.5)
GFR calc Af Amer: >90 mL/min (ref >90)
GFR calc non Af Amer: >90 mL/min (ref >90)
Glucose, Bld: 77 mg/dL (ref 70–99)
Potassium: 3.9 mEq/L (ref 3.5–5.1)
Sodium: 138 mEq/L (ref 135–145)

## 2013-01-30 LAB — ABO/RH: ABO/RH(D): A POS

## 2013-01-30 LAB — TYPE AND SCREEN
ABO/RH(D): A POS
Antibody Screen: NEGATIVE

## 2013-01-30 LAB — SURGICAL PCR SCREEN: Staphylococcus aureus: POSITIVE — AB

## 2013-01-30 NOTE — Pre-Procedure Instructions (Signed)
Akanksha Bellmore  01/30/2013   Your procedure is scheduled on: Tuesday December 2 nd at 1150 AM  Report to [facility] Main Entrance "A" at 0845 AM.  Call this number if you have problems the morning of surgery: [phone]   Remember:   Do not eat food or drink liquids after midnight.   Take these medicines the morning of surgery with A SIP OF WATER: Nexium (Esomeprazole), Tramadol if needed for pain and Effexor-XR   Stop Vitamins, Herbal medication, Nsaids (Ibuprofen, Advil, Motrin, Naproxen) and Aspirin  Do not wear jewelry, make-up or nail polish.  Do not wear lotions, powders, or perfumes. You may wear deodorant.  Do not shave 48 hours prior to surgery.   Do not bring valuables to the hospital.  [facility] is not responsiblle for any belongings or valuables.               Contacts, dentures or bridgework may not be worn into surgery.  Leave suitcase in the car. After surgery it may be brought to your room.  For patients admitted to the hospital, discharge time is determined by your treatment team.               Patients discharged the day of surgery will not be allowed to drive home.    Special Instructions: Shower using CHG 2 nights before surgery and the night before surgery.  If you shower the day of surgery use CHG.  Use special wash - you have one bottle of CHG for all showers.  You should use approximately 1/3 of the bottle for each shower.   Please read over the following fact sheets that you were given: Pain Booklet, Coughing and Deep Breathing, Blood Transfusion Information, MRSA Information and Surgical Site Infection Prevention

## 2013-02-09 ENCOUNTER — Encounter (HOSPITAL_COMMUNITY): Payer: Self-pay | Admitting: Certified Registered Nurse Anesthetist

## 2013-02-09 MED ORDER — CEFAZOLIN SODIUM-DEXTROSE 2-3 GM-% IV SOLR
2.0000 g | INTRAVENOUS | Status: AC
  Administered 2013-02-10: 2 g via INTRAVENOUS
  Filled 2013-02-09: qty 50

## 2013-02-09 NOTE — H&P (Signed)
Sylvia [name] is an 52 y.o. female.   Chief Complaint: lbp HPI: patient who underwent lumbar discectomy which some improvement at the beginning, nevertheless continues with lumbar pain with radiation to the right leg , no better with conservative treatment, including epidural, pt. 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 In viiw on no improvement her insurance agrees with decompression and fusion. She wanted  To wear a titanium screw to be sure she was not allergic to the metal Past Medical History  Diagnosis Date  . Back pain   . Headache(784.0)   . Complication of anesthesia     headache  . Arthritis     Past Surgical History  Procedure Laterality Date  . Appendectomy    . Lumbar laminectomy/decompression microdiscectomy  06/20/2011    Procedure: LUMBAR LAMINECTOMY/DECOMPRESSION MICRODISCECTOMY 1 LEVEL;  Surgeon: Karn Cassis, MD;  Location: [facility];  Service: Neurosurgery;  Laterality: Right;  Right Lumbar four-five  Foraminotomy    No family history on file. Social History:  reports that she has never smoked. She has never used smokeless tobacco. She reports that she does not drink alcohol or use illicit drugs.  Allergies: No Known Allergies  No prescriptions prior to admission    No results found for this or any previous visit (from the past 48 hour(s)). No results found.  Review of Systems  Constitutional: Negative.   Eyes: Negative.   Gastrointestinal: Negative.   Genitourinary: Negative.   Musculoskeletal: Positive for back pain.  Skin: Positive for itching.  Neurological: Positive for sensory change and focal weakness.  Endo/Heme/Allergies: Negative.   Psychiatric/Behavioral: Negative.     Last menstrual period 06/14/2003. Physical Exam seen one more time dec1/14. She came limping from the right leg. Hent, nl. Neck, nl. Cv, nl. Lungs, clear. Abdomen, soft. Extremities, nl. NEURO weakness of DF right foot. Decrease bof sensation affecting the l4-5  dermatomes. SLR positive at 45 degrees. 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 She is aware of risks and benefits  Audrinna Sherman M 02/09/2013, 5:49 PM

## 2013-02-10 ENCOUNTER — Ambulatory Visit (HOSPITAL_COMMUNITY): Payer: Worker's Compensation

## 2013-02-10 ENCOUNTER — Encounter (HOSPITAL_COMMUNITY)
Admission: RE | Disposition: A | Discharge status: Home Health | Payer: Worker's Compensation | Source: Ambulatory Visit | Attending: Neurosurgery

## 2013-02-10 ENCOUNTER — Encounter (HOSPITAL_COMMUNITY): Payer: Self-pay | Admitting: *Deleted

## 2013-02-10 ENCOUNTER — Encounter (HOSPITAL_COMMUNITY): Payer: Worker's Compensation | Admitting: Certified Registered Nurse Anesthetist

## 2013-02-10 ENCOUNTER — Ambulatory Visit (HOSPITAL_COMMUNITY): Payer: Worker's Compensation | Admitting: Certified Registered Nurse Anesthetist

## 2013-02-10 ENCOUNTER — Inpatient Hospital Stay (HOSPITAL_COMMUNITY)
Admission: RE | Admit: 2013-02-10 | Discharge: 2013-02-17 | DRG: 460 | Disposition: A | Discharge status: Home Health | Payer: Worker's Compensation | Source: Ambulatory Visit | Attending: Neurosurgery | Admitting: Neurosurgery

## 2013-02-10 DIAGNOSIS — M5116 Intervertebral disc disorders with radiculopathy, lumbar region: Secondary | ICD-10-CM | POA: Diagnosis present

## 2013-02-10 DIAGNOSIS — G8918 Other acute postprocedural pain: Secondary | ICD-10-CM | POA: Diagnosis not present

## 2013-02-10 DIAGNOSIS — L270 Generalized skin eruption due to drugs and medicaments taken internally: Secondary | ICD-10-CM | POA: Diagnosis not present

## 2013-02-10 DIAGNOSIS — T3695XA Adverse effect of unspecified systemic antibiotic, initial encounter: Secondary | ICD-10-CM | POA: Diagnosis not present

## 2013-02-10 DIAGNOSIS — M51379 Other intervertebral disc degeneration, lumbosacral region without mention of lumbar back pain or lower extremity pain: Principal | ICD-10-CM | POA: Diagnosis present

## 2013-02-10 DIAGNOSIS — M5137 Other intervertebral disc degeneration, lumbosacral region: Principal | ICD-10-CM | POA: Diagnosis present

## 2013-02-10 SURGERY — POSTERIOR LUMBAR FUSION 1 LEVEL
Anesthesia: General | Site: Spine Lumbar

## 2013-02-10 MED ORDER — ONDANSETRON HCL 4 MG/2ML IJ SOLN: 4.0000 mg | Freq: Once | INTRAMUSCULAR | Status: DC | PRN

## 2013-02-10 MED ORDER — PROPOFOL 10 MG/ML IV BOLUS
INTRAVENOUS | Status: DC | PRN
  Administered 2013-02-10: 150 mg via INTRAVENOUS

## 2013-02-10 MED ORDER — ZOLPIDEM TARTRATE 5 MG PO TABS
5.0000 mg | ORAL_TABLET | Freq: Every evening | ORAL | Status: DC | PRN
  Administered 2013-02-12: 5 mg via ORAL
  Filled 2013-02-10: qty 1

## 2013-02-10 MED ORDER — SODIUM CHLORIDE 0.9 % IV SOLN: 250.0000 mL | INTRAVENOUS | Status: DC

## 2013-02-10 MED ORDER — ARTIFICIAL TEARS OP OINT
TOPICAL_OINTMENT | OPHTHALMIC | Status: DC | PRN
  Administered 2013-02-10: 1 via OPHTHALMIC

## 2013-02-10 MED ORDER — BUPIVACAINE LIPOSOME 1.3 % IJ SUSP
20.0000 mL | INTRAMUSCULAR | Status: AC
  Administered 2013-02-10: 20 mL
  Filled 2013-02-10: qty 20

## 2013-02-10 MED ORDER — MENTHOL 3 MG MT LOZG: 1.0000 | LOZENGE | OROMUCOSAL | Status: DC | PRN

## 2013-02-10 MED ORDER — HYDROMORPHONE HCL PF 1 MG/ML IJ SOLN
INTRAMUSCULAR | Status: AC
  Filled 2013-02-10: qty 1

## 2013-02-10 MED ORDER — PHENOL 1.4 % MT LIQD: 1.0000 | OROMUCOSAL | Status: DC | PRN

## 2013-02-10 MED ORDER — NALOXONE HCL 0.4 MG/ML IJ SOLN: 0.4000 mg | INTRAMUSCULAR | Status: DC | PRN

## 2013-02-10 MED ORDER — DIAZEPAM 5 MG PO TABS
5.0000 mg | ORAL_TABLET | Freq: Four times a day (QID) | ORAL | Status: DC | PRN
  Administered 2013-02-10 – 2013-02-16 (×13): 5 mg via ORAL
  Filled 2013-02-10 (×12): qty 1

## 2013-02-10 MED ORDER — SODIUM CHLORIDE 0.9 % IJ SOLN: 9.0000 mL | INTRAMUSCULAR | Status: DC | PRN

## 2013-02-10 MED ORDER — FENTANYL CITRATE 0.05 MG/ML IJ SOLN
100.0000 ug | Freq: Once | INTRAMUSCULAR | Status: AC
  Administered 2013-02-10: 100 ug via INTRAVENOUS

## 2013-02-10 MED ORDER — HYDROMORPHONE HCL PF 1 MG/ML IJ SOLN
0.2500 mg | INTRAMUSCULAR | Status: DC | PRN
  Administered 2013-02-10 (×4): 0.5 mg via INTRAVENOUS

## 2013-02-10 MED ORDER — MORPHINE SULFATE (PF) 1 MG/ML IV SOLN
INTRAVENOUS | Status: DC
  Administered 2013-02-10: 17:00:00 via INTRAVENOUS
  Administered 2013-02-11: 9 mg via INTRAVENOUS

## 2013-02-10 MED ORDER — CEFAZOLIN SODIUM 1-5 GM-% IV SOLN
1.0000 g | Freq: Three times a day (TID) | INTRAVENOUS | Status: AC
  Administered 2013-02-10 – 2013-02-11 (×2): 1 g via INTRAVENOUS
  Filled 2013-02-10 (×2): qty 50

## 2013-02-10 MED ORDER — OXYCODONE-ACETAMINOPHEN 5-325 MG PO TABS
1.0000 | ORAL_TABLET | ORAL | Status: DC | PRN
  Administered 2013-02-10: 1 via ORAL
  Administered 2013-02-10 – 2013-02-17 (×31): 2 via ORAL
  Filled 2013-02-10 (×32): qty 2

## 2013-02-10 MED ORDER — MIDAZOLAM HCL 5 MG/5ML IJ SOLN
INTRAMUSCULAR | Status: DC | PRN
  Administered 2013-02-10: 2 mg via INTRAVENOUS

## 2013-02-10 MED ORDER — THROMBIN 20000 UNITS EX SOLR
CUTANEOUS | Status: DC | PRN
  Administered 2013-02-10: 13:00:00 via TOPICAL

## 2013-02-10 MED ORDER — FENTANYL CITRATE 0.05 MG/ML IJ SOLN
INTRAMUSCULAR | Status: AC
  Filled 2013-02-10: qty 2

## 2013-02-10 MED ORDER — ONDANSETRON HCL 4 MG/2ML IJ SOLN: 4.0000 mg | INTRAMUSCULAR | Status: DC | PRN

## 2013-02-10 MED ORDER — VENLAFAXINE HCL ER 75 MG PO CP24
75.0000 mg | ORAL_CAPSULE | Freq: Every day | ORAL | Status: DC
  Administered 2013-02-12 – 2013-02-17 (×6): 75 mg via ORAL
  Filled 2013-02-10 (×7): qty 1

## 2013-02-10 MED ORDER — THROMBIN 5000 UNITS EX SOLR
OROMUCOSAL | Status: DC | PRN
  Administered 2013-02-10: 13:00:00 via TOPICAL

## 2013-02-10 MED ORDER — ACETAMINOPHEN 650 MG RE SUPP: 650.0000 mg | RECTAL | Status: DC | PRN

## 2013-02-10 MED ORDER — GLYCOPYRROLATE 0.2 MG/ML IJ SOLN
INTRAMUSCULAR | Status: DC | PRN
  Administered 2013-02-10: 0.3 mg via INTRAVENOUS
  Administered 2013-02-10: 0.1 mg via INTRAVENOUS

## 2013-02-10 MED ORDER — DIPHENHYDRAMINE HCL 12.5 MG/5ML PO ELIX: 12.5000 mg | ORAL_SOLUTION | Freq: Four times a day (QID) | ORAL | Status: DC | PRN

## 2013-02-10 MED ORDER — SODIUM CHLORIDE 0.9 % IJ SOLN
3.0000 mL | Freq: Two times a day (BID) | INTRAMUSCULAR | Status: DC
  Administered 2013-02-11 – 2013-02-16 (×9): 3 mL via INTRAVENOUS

## 2013-02-10 MED ORDER — DIAZEPAM 5 MG PO TABS
ORAL_TABLET | ORAL | Status: AC
  Filled 2013-02-10: qty 1

## 2013-02-10 MED ORDER — 0.9 % SODIUM CHLORIDE (POUR BTL) OPTIME
TOPICAL | Status: DC | PRN
  Administered 2013-02-10: 1000 mL

## 2013-02-10 MED ORDER — OXYCODONE-ACETAMINOPHEN 5-325 MG PO TABS
ORAL_TABLET | ORAL | Status: AC
  Filled 2013-02-10: qty 1

## 2013-02-10 MED ORDER — MORPHINE SULFATE (PF) 1 MG/ML IV SOLN
INTRAVENOUS | Status: AC
  Filled 2013-02-10: qty 25

## 2013-02-10 MED ORDER — ROCURONIUM BROMIDE 100 MG/10ML IV SOLN
INTRAVENOUS | Status: DC | PRN
  Administered 2013-02-10 (×3): 10 mg via INTRAVENOUS
  Administered 2013-02-10: 40 mg via INTRAVENOUS

## 2013-02-10 MED ORDER — FENTANYL CITRATE 0.05 MG/ML IJ SOLN
INTRAMUSCULAR | Status: DC | PRN
  Administered 2013-02-10 (×2): 100 ug via INTRAVENOUS
  Administered 2013-02-10: 50 ug via INTRAVENOUS

## 2013-02-10 MED ORDER — LACTATED RINGERS IV SOLN
INTRAVENOUS | Status: DC
  Administered 2013-02-10 (×2): via INTRAVENOUS

## 2013-02-10 MED ORDER — ONDANSETRON HCL 4 MG/2ML IJ SOLN
INTRAMUSCULAR | Status: DC | PRN
  Administered 2013-02-10: 4 mg via INTRAVENOUS

## 2013-02-10 MED ORDER — PHENYLEPHRINE HCL 10 MG/ML IJ SOLN
INTRAMUSCULAR | Status: DC | PRN
  Administered 2013-02-10 (×2): 40 ug via INTRAVENOUS
  Administered 2013-02-10: 120 ug via INTRAVENOUS
  Administered 2013-02-10 (×2): 40 ug via INTRAVENOUS
  Administered 2013-02-10 (×3): 80 ug via INTRAVENOUS
  Administered 2013-02-10: 40 ug via INTRAVENOUS
  Administered 2013-02-10: 120 ug via INTRAVENOUS
  Administered 2013-02-10: 40 ug via INTRAVENOUS

## 2013-02-10 MED ORDER — SODIUM CHLORIDE 0.9 % IV SOLN: INTRAVENOUS | Status: DC

## 2013-02-10 MED ORDER — ALBUMIN HUMAN 5 % IV SOLN
INTRAVENOUS | Status: DC | PRN
  Administered 2013-02-10: 15:00:00 via INTRAVENOUS

## 2013-02-10 MED ORDER — SODIUM CHLORIDE 0.9 % IJ SOLN: 3.0000 mL | INTRAMUSCULAR | Status: DC | PRN

## 2013-02-10 MED ORDER — ONDANSETRON HCL 4 MG/2ML IJ SOLN: 4.0000 mg | Freq: Four times a day (QID) | INTRAMUSCULAR | Status: DC | PRN

## 2013-02-10 MED ORDER — NEOSTIGMINE METHYLSULFATE 1 MG/ML IJ SOLN
INTRAMUSCULAR | Status: DC | PRN
  Administered 2013-02-10: 1 mg via INTRAVENOUS
  Administered 2013-02-10: 2 mg via INTRAVENOUS

## 2013-02-10 MED ORDER — MIDAZOLAM HCL 2 MG/2ML IJ SOLN: 2.0000 mg | Freq: Once | INTRAMUSCULAR | Status: DC

## 2013-02-10 MED ORDER — LIDOCAINE HCL (CARDIAC) 20 MG/ML IV SOLN
INTRAVENOUS | Status: DC | PRN
  Administered 2013-02-10: 40 mg via INTRAVENOUS

## 2013-02-10 MED ORDER — DIPHENHYDRAMINE HCL 50 MG/ML IJ SOLN: 12.5000 mg | Freq: Four times a day (QID) | INTRAMUSCULAR | Status: DC | PRN

## 2013-02-10 MED ORDER — SODIUM CHLORIDE 0.9 % IV SOLN
INTRAVENOUS | Status: DC | PRN
  Administered 2013-02-10: 15:00:00 via INTRAVENOUS

## 2013-02-10 MED ORDER — EPHEDRINE SULFATE 50 MG/ML IJ SOLN
INTRAMUSCULAR | Status: DC | PRN
  Administered 2013-02-10 (×2): 10 mg via INTRAVENOUS
  Administered 2013-02-10: 5 mg via INTRAVENOUS

## 2013-02-10 MED ORDER — ACETAMINOPHEN 325 MG PO TABS: 650.0000 mg | ORAL_TABLET | ORAL | Status: DC | PRN

## 2013-02-10 SURGICAL SUPPLY — 68 items
APL SKNCLS STERI-STRIP NONHPOA (GAUZE/BANDAGES/DRESSINGS) ×1
BENZOIN TINCTURE PRP APPL 2/3 (GAUZE/BANDAGES/DRESSINGS) ×2 IMPLANT
BLADE SURG ROTATE 9660 (MISCELLANEOUS) IMPLANT
BUR ACORN 6.0 (BURR) ×2 IMPLANT
BUR MATCHSTICK NEURO 3.0 LAGG (BURR) ×2 IMPLANT
CANISTER SUCT 3000ML (MISCELLANEOUS) ×2 IMPLANT
CAP REVERE LOCKING (Cap) ×4 IMPLANT
CONT SPEC 4OZ CLIKSEAL STRL BL (MISCELLANEOUS) ×3 IMPLANT
COVER BACK TABLE 24X17X13 BIG (DRAPES) IMPLANT
COVER TABLE BACK 60X90 (DRAPES) ×2 IMPLANT
DRAPE C-ARM 42X72 X-RAY (DRAPES) ×4 IMPLANT
DRAPE LAPAROTOMY 100X72X124 (DRAPES) ×2 IMPLANT
DRAPE POUCH INSTRU U-SHP 10X18 (DRAPES) ×2 IMPLANT
DRSG PAD ABDOMINAL 8X10 ST (GAUZE/BANDAGES/DRESSINGS) ×1 IMPLANT
DURAPREP 26ML APPLICATOR (WOUND CARE) ×2 IMPLANT
ELECT REM PT RETURN 9FT ADLT (ELECTROSURGICAL) ×2
ELECTRODE REM PT RTRN 9FT ADLT (ELECTROSURGICAL) ×1 IMPLANT
EVACUATOR 1/8 PVC DRAIN (DRAIN) ×1 IMPLANT
GAUZE SPONGE 4X4 16PLY XRAY LF (GAUZE/BANDAGES/DRESSINGS) ×2 IMPLANT
GLOVE BIOGEL M 8.0 STRL (GLOVE) ×2 IMPLANT
GLOVE BIOGEL PI IND STRL 7.0 (GLOVE) IMPLANT
GLOVE BIOGEL PI IND STRL 8 (GLOVE) IMPLANT
GLOVE BIOGEL PI INDICATOR 7.0 (GLOVE) ×2
GLOVE BIOGEL PI INDICATOR 8 (GLOVE) ×2
GLOVE ECLIPSE 7.5 STRL STRAW (GLOVE) ×3 IMPLANT
GLOVE EXAM NITRILE LRG STRL (GLOVE) IMPLANT
GLOVE EXAM NITRILE MD LF STRL (GLOVE) IMPLANT
GLOVE EXAM NITRILE XL STR (GLOVE) IMPLANT
GLOVE EXAM NITRILE XS STR PU (GLOVE) IMPLANT
GOWN BRE IMP SLV AUR LG STRL (GOWN DISPOSABLE) ×2 IMPLANT
GOWN BRE IMP SLV AUR XL STRL (GOWN DISPOSABLE) IMPLANT
GOWN STRL REIN 2XL LVL4 (GOWN DISPOSABLE) ×1 IMPLANT
KIT BASIN OR (CUSTOM PROCEDURE TRAY) ×2 IMPLANT
KIT INFUSE SMALL (Orthopedic Implant) ×1 IMPLANT
KIT ROOM TURNOVER OR (KITS) ×2 IMPLANT
NDL HYPO 18GX1.5 BLUNT FILL (NEEDLE) IMPLANT
NDL HYPO 21X1.5 SAFETY (NEEDLE) IMPLANT
NDL HYPO 25X1 1.5 SAFETY (NEEDLE) IMPLANT
NEEDLE HYPO 18GX1.5 BLUNT FILL (NEEDLE) IMPLANT
NEEDLE HYPO 21X1.5 SAFETY (NEEDLE) ×2 IMPLANT
NEEDLE HYPO 25X1 1.5 SAFETY (NEEDLE) ×2 IMPLANT
NS IRRIG 1000ML POUR BTL (IV SOLUTION) ×2 IMPLANT
PACK LAMINECTOMY NEURO (CUSTOM PROCEDURE TRAY) ×2 IMPLANT
PAD ARMBOARD 7.5X6 YLW CONV (MISCELLANEOUS) ×8 IMPLANT
PATTIES SURGICAL .5 X1 (DISPOSABLE) ×1 IMPLANT
PATTIES SURGICAL .5 X3 (DISPOSABLE) IMPLANT
ROD REVERE 6.35 40MM (Rod) ×1 IMPLANT
ROD REVERE CURVED 6.35X35MM (Rod) ×1 IMPLANT
SCREW REVERE 6.35 5.5X40MM (Screw) ×4 IMPLANT
SENSORCAINE 0.5% W/EPI 1:200,000 IMPLANT
SPACER SUSTAIN O SML 8X22 15MM (Spacer) ×2 IMPLANT
SPONGE GAUZE 4X4 12PLY (GAUZE/BANDAGES/DRESSINGS) ×2 IMPLANT
SPONGE LAP 4X18 X RAY DECT (DISPOSABLE) IMPLANT
SPONGE NEURO XRAY DETECT 1X3 (DISPOSABLE) IMPLANT
SPONGE SURGIFOAM ABS GEL 100 (HEMOSTASIS) ×2 IMPLANT
STRIP CLOSURE SKIN 1/2X4 (GAUZE/BANDAGES/DRESSINGS) ×2 IMPLANT
SUT VIC AB 1 CT1 18XBRD ANBCTR (SUTURE) ×2 IMPLANT
SUT VIC AB 1 CT1 8-18 (SUTURE) ×4
SUT VIC AB 2-0 CP2 18 (SUTURE) ×3 IMPLANT
SUT VIC AB 3-0 SH 8-18 (SUTURE) ×2 IMPLANT
SYR 20CC LL (SYRINGE) ×1 IMPLANT
SYR 20ML ECCENTRIC (SYRINGE) ×2 IMPLANT
SYR 5ML LL (SYRINGE) IMPLANT
TAPE CLOTH SURG 4X10 WHT LF (GAUZE/BANDAGES/DRESSINGS) ×1 IMPLANT
TOWEL OR 17X24 6PK STRL BLUE (TOWEL DISPOSABLE) ×2 IMPLANT
TOWEL OR 17X26 10 PK STRL BLUE (TOWEL DISPOSABLE) ×2 IMPLANT
TRAY FOLEY CATH 14FRSI W/METER (CATHETERS) ×2 IMPLANT
WATER STERILE IRR 1000ML POUR (IV SOLUTION) ×2 IMPLANT

## 2013-02-10 NOTE — Progress Notes (Signed)
Op note [phone]

## 2013-02-10 NOTE — Anesthesia Postprocedure Evaluation (Signed)
  Anesthesia Post-op Note  Patient: Sylvia [name]  Procedure(s) Performed: Procedure(s): LUMBAR FOUR-FIVE POSTERIOR LUMBAR INTERBODY FUSION (N/A)  Patient Location: PACU  Anesthesia Type:General  Level of Consciousness: awake, alert  and oriented  Airway and Oxygen Therapy: Patient Spontanous Breathing and Patient connected to nasal cannula oxygen  Post-op Pain: mild  Post-op Assessment: Post-op Vital signs reviewed, Patient's Cardiovascular Status Stable, Respiratory Function Stable, Patent Airway and Pain level controlled  Post-op Vital Signs: stable  Complications: No apparent anesthesia complications

## 2013-02-10 NOTE — Anesthesia Procedure Notes (Signed)
Procedure Name: Intubation Date/Time: 02/10/2013 12:12 PM Performed by: Jefm Miles E Pre-anesthesia Checklist: Patient identified Patient Re-evaluated:Patient Re-evaluated prior to inductionOxygen Delivery Method: Circle system utilized Preoxygenation: Pre-oxygenation with 100% oxygen Intubation Type: IV induction Ventilation: Mask ventilation without difficulty Laryngoscope Size: Mac and 3 Grade View: Grade I Tube type: Oral Tube size: 7.0 mm Number of attempts: 1 Airway Equipment and Method: Stylet Placement Confirmation: ETT inserted through vocal cords under direct vision,  positive ETCO2 and breath sounds checked- equal and bilateral Secured at: 20 cm Tube secured with: Tape Dental Injury: Teeth and Oropharynx as per pre-operative assessment

## 2013-02-10 NOTE — Transfer of Care (Signed)
Immediate Anesthesia Transfer of Care Note  Patient: Sylvia [name]  Procedure(s) Performed: Procedure(s): LUMBAR FOUR-FIVE POSTERIOR LUMBAR INTERBODY FUSION (N/A)  Patient Location: PACU  Anesthesia Type:General  Level of Consciousness: awake and oriented  Airway & Oxygen Therapy: Patient Spontanous Breathing and Patient connected to nasal cannula oxygen  Post-op Assessment: Report given to PACU RN and Patient moving all extremities X 4  Post vital signs: Reviewed and stable  Complications: No apparent anesthesia complications

## 2013-02-10 NOTE — Anesthesia Preprocedure Evaluation (Signed)
Anesthesia Evaluation  Patient identified by MRN, date of birth, ID band Patient awake    Reviewed: Allergy & Precautions, H&P , NPO status , Patient's Chart, lab work & pertinent test results  Airway Mallampati: II TM Distance: >3 FB Neck ROM: Full    Dental  (+) Teeth Intact and Dental Advisory Given   Pulmonary  breath sounds clear to auscultation        Cardiovascular Rate:Normal     Neuro/Psych    GI/Hepatic   Endo/Other    Renal/GU      Musculoskeletal   Abdominal   Peds  Hematology   Anesthesia Other Findings   Reproductive/Obstetrics                           Anesthesia Physical Anesthesia Plan  ASA: II  Anesthesia Plan: General   Post-op Pain Management:    Induction: Intravenous  Airway Management Planned: Oral ETT  Additional Equipment:   Intra-op Plan:   Post-operative Plan: Extubation in OR  Informed Consent: I have reviewed the patients History and Physical, chart, labs and discussed the procedure including the risks, benefits and alternatives for the proposed anesthesia with the patient or authorized representative who has indicated his/her understanding and acceptance.   Dental advisory given  Plan Discussed with: CRNA and Anesthesiologist  Anesthesia Plan Comments: (HNP L 4-5  Plan GA with oral ETT  Kipp Brood, MD )        Anesthesia Quick Evaluation

## 2013-02-10 NOTE — Preoperative (Signed)
Beta Blockers   Reason not to administer Beta Blockers:Not Applicable 

## 2013-02-11 MED ORDER — SIMETHICONE 80 MG PO CHEW
80.0000 mg | CHEWABLE_TABLET | Freq: Four times a day (QID) | ORAL | Status: DC | PRN
  Filled 2013-02-11: qty 1

## 2013-02-11 MED ORDER — ALUM & MAG HYDROXIDE-SIMETH 200-200-20 MG/5ML PO SUSP: 30.0000 mL | Freq: Four times a day (QID) | ORAL | Status: DC | PRN

## 2013-02-11 NOTE — Evaluation (Signed)
Occupational Therapy Evaluation Patient Details Name: Kaeden Depaz MRN: [id number] DOB: 11/03/1960 Today's Date: 02/11/2013 Time: 7829-5621 OT Time Calculation (min): 26 min  OT Assessment / Plan / Recommendation History of present illness 52 yo female s/p L4-5  PLIF   Clinical Impression   Patient is s/p PLIF L4-5 surgery resulting in functional limitations due to the deficits listed below (see OT problem list).  Patient will benefit from skilled OT acutely to increase independence and safety with ADLS to allow discharge HHOT.     OT Assessment  Patient needs continued OT Services    Follow Up Recommendations  Home health OT;Supervision - Intermittent    Barriers to Discharge      Equipment Recommendations  Other (comment) (RW)    Recommendations for Other Services    Frequency  Min 2X/week    Precautions / Restrictions Precautions Precautions: Back Precaution Comments: back handout provided and reviewed precautions   Pertinent Vitals/Pain 6 out 10 pain Premedicated  Pt states "i can handle this"    ADL  Eating/Feeding: Independent Where Assessed - Eating/Feeding: Chair Grooming: Wash/dry hands;Wash/dry face;Set up Where Assessed - Grooming: Supported sitting Upper Body Dressing: Supervision/safety Where Assessed - Upper Body Dressing: Supported sitting Lower Body Dressing: Moderate assistance Where Assessed - Lower Body Dressing: Supine, head of bed up (able to bend knees and lift leg for don socks) Toilet Transfer: Min Pension scheme manager Method: Sit to Barista: Raised toilet seat with arms (or 3-in-1 over toilet) Equipment Used: Gait belt;Rolling walker Transfers/Ambulation Related to ADLs: Pt ambulated from EOB to chair level. ADL Comments: Pt educated on back precautions with bed mobility. pt provided visual demo of back precautions. Pt requesting depression medication from personal bag,. RN Arley Phenix called to room to verify this medication  was okay to take at this time. RN present and medication provided from personal belongings. pt positioned in chair watching movie at end of session    OT Diagnosis: Generalized weakness;Acute pain  OT Problem List: Decreased strength;Decreased activity tolerance;Decreased safety awareness;Decreased knowledge of use of DME or AE;Decreased knowledge of precautions;Pain OT Treatment Interventions: Self-care/ADL training;Therapeutic exercise;DME and/or AE instruction;Therapeutic activities;Patient/family education;Balance training   OT Goals(Current goals can be found in the care plan section) Acute Rehab OT Goals Patient Stated Goal: none specifically stated at this time. OT educated that next session will focus on LB dressing and tub transfer OT Goal Formulation: With patient Time For Goal Achievement: 02/25/13 Potential to Achieve Goals: Good  Visit Information  Last OT Received On: 02/11/13 Assistance Needed: +1 History of Present Illness: 52 yo female s/p L4-5  PLIF       Prior Functioning     Home Living Family/patient expects to be discharged to:: Private residence Living Arrangements: Children Available Help at Discharge: Family Type of Home: Apartment Home Access: Stairs to enter Secretary/administrator of Steps: 15 Entrance Stairs-Rails: Right Home Layout: One level Home Equipment: None Prior Function Level of Independence: Independent Communication Communication: No difficulties Dominant Hand: Right         Vision/Perception Vision - History Baseline Vision: Other (comment) (glasses for driving) Patient Visual Report: No change from baseline   Cognition  Cognition Arousal/Alertness: Awake/alert Behavior During Therapy: WFL for tasks assessed/performed Overall Cognitive Status: Within Functional Limits for tasks assessed    Extremity/Trunk Assessment Upper Extremity Assessment Upper Extremity Assessment: Overall WFL for tasks assessed Lower Extremity  Assessment Lower Extremity Assessment: Defer to PT evaluation     Mobility Bed Mobility Bed  Mobility: Supine to Sit;Sitting - Scoot to [place] of Bed;Rolling Left;Left Sidelying to Sit Rolling Left: 4: Min assist;With rail Left Sidelying to Sit: 4: Min assist;With rails;HOB elevated Supine to Sit: 4: Min assist;With rails;HOB elevated Sitting - Scoot to [place] of Bed: 4: Min assist Details for Bed Mobility Assistance: Pt educated on sequence with back precautions. Pt with good return demo Transfers Transfers: Sit to Stand;Stand to Sit Sit to Stand: 4: Min assist;With upper extremity assist;From bed Stand to Sit: 4: Min guard;With upper extremity assist;To chair/3-in-1 Details for Transfer Assistance: cues for hand placement     Exercise     Balance     End of Session OT - End of Session Activity Tolerance: Patient tolerated treatment well Patient left: in chair;with call bell/phone within reach Nurse Communication: Mobility status;Precautions  GO     Harolyn Rutherford 02/11/2013, 8:32 AM  Pager: [phone]

## 2013-02-11 NOTE — Plan of Care (Signed)
Problem: Consults Goal: Diagnosis - Spinal Surgery Outcome: Completed/Met Date Met:  02/11/13 [place]/Lumbar Spine Fusion

## 2013-02-11 NOTE — Op Note (Signed)
Sylvia [name], Sylvia [name] NO.:  [id number]  MEDICAL RECORD NO.:  [id number]  LOCATION:  [id number]                        FACILITY:  [facility]  PHYSICIAN:  Hilda Lias, M.D.   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  Status post L4-5 diskectomy.  POSTOPERATIVE DIAGNOSIS:  L4-5 degenerative disk disease with right L4- L5 chronic radiculopathy.  Status post L4-5 diskectomy.  PROCEDURE:  Removal of posterior arch of L4, which involves spinous process, lamina of facet.  Lysis of adhesion bilaterally, bilateral L4- L5 total gross diskectomy, normal, to be able to insert two cages with 14 x 22, pedicle screws at L4-L5, posterolateral arthrodesis with BMP and autograft.  Microscope.  Cell Saver, C-arm.  SURGEON:  Hilda Lias, M.D.  ASSISTANT:  Danae Orleans. Venetia Maxon, M.D.  CLINICAL HISTORY:  The lady is a patient who had surgery 18 months ago for 4-5 diskectomy.  The patient did well, but lately, she is complaining of more pain with radiation down to the right leg, not better with conservative treatment, including being seen in the Pain Clinic.  MRI showed degenerative disk disease at L4-5 bilaterally, right worse than the left one.  Surgery was advised and the patient knew the risk of the surgery.  DESCRIPTION OF PROCEDURE:  The patient was taken to the OR, and after intubation, she was positioned on prone manner.  The back was cleaned with DuraPrep and drapes were applied.  Midline incision resecting the previous scar was made from L3 to L4-L5.  Muscles were retracted all the way laterally, to be able to see and feel the transverse process of L4- L5.  From then on, we found quite a bit of scar tissue in the right side and we started with dissection removing the spinous process of L4, the lamina  of L4 in the left with facetectomy on the left side, but surprise was done, the patient has quite a bit of fibrosis anterolaterally in the left side, which was not involved in the first order.  Then, with the help of the microscope, we did lysis of adhesion in the right side to free the L4 and L5 nerve root and be able to retract the thecal sac. Then, we entered the disk space bilaterally and total gross diskectomy with removal of a large amount of degenerative disk, was removed.  The diskectomy was medial all the way laterally, more than normal, to be able to set the cages.  Then, the endplate were removed and two cages of 15 x 22 with BMP and autograft were inserted.  Lateral lumbar spine showed good position of the cages.  Then with the help of the C-arm in AP and then in lateral view, we probed the pedicle of L4 and L5.  Prior to insertion of the screws, we introduced a feeler to see that we were surrounded by bone at L4 quadrants, which we were.  Then, four cages of 5.5 x 40 were inserted, that was kept in place with a  rod and Capps.  X- rays showed good position of the cages as well as the pedicle screws. We were able to feel the medial aspect of the all four pedicles just to be sure there was no perforation.  Then with the Le[place]ell as well as the drill, we removed the periosteum of L4-5 facet laterally and a mix of BMP and autograft were used for the arthrodesis.  The area was irrigated.  A drain was left and the wound was closed with Vicryl and Steri-Strips. Hemovac was left in the epidural space.          ______________________________ Hilda Lias, M.D.     EB/MEDQ  D:  02/10/2013  T:  02/11/2013  Job:  [id number]

## 2013-02-11 NOTE — Progress Notes (Signed)
Patient ID: Sylvia [name], female   DOB: 1960/12/13, 52 y.o.   MRN: [id number] Not using the pca. Drain with some serous material. Feels better.

## 2013-02-11 NOTE — Evaluation (Signed)
Physical Therapy Evaluation Patient Details Name: Sylvia [name] MRN: [id number] DOB: 1960-09-01 Today's Date: 02/11/2013 Time: 0950-1009 PT Time Calculation (min): 19 min  PT Assessment / Plan / Recommendation History of Present Illness  52 yo female s/p L4-5  PLIF  Clinical Impression  Pt very motivated to improve mobility, but fatigues easily.  Pt will have support from family at D/C and anticipate will make good progress.  Will continue to follow.      PT Assessment  Patient needs continued PT services    Follow Up Recommendations  Home health PT;Supervision/Assistance - 24 hour    Does the patient have the potential to tolerate intense rehabilitation      Barriers to Discharge        Equipment Recommendations  Rolling walker with 5" wheels    Recommendations for Other Services     Frequency Min 5X/week    Precautions / Restrictions Precautions Precautions: Back Precaution Comments: back handout provided and reviewed precautions Restrictions Weight Bearing Restrictions: No   Pertinent Vitals/Pain "Sore at incision".  Pt with PCA.        Mobility  Bed Mobility Bed Mobility: Not assessed Rolling Left: 4: Min assist;With rail Left Sidelying to Sit: 4: Min assist;With rails;HOB elevated Supine to Sit: 4: Min assist;With rails;HOB elevated Sitting - Scoot to Edge of Bed: 4: Min assist Details for Bed Mobility Assistance: Pt educated on sequence with back precautions. Pt with good return demo Transfers Transfers: Sit to Stand;Stand to Sit Sit to Stand: 4: Min guard;With upper extremity assist;From chair/3-in-1 Stand to Sit: 4: Min guard;With upper extremity assist;To chair/3-in-1 Details for Transfer Assistance: demos good use of UEs.   Ambulation/Gait Ambulation/Gait Assistance: 4: Min guard Ambulation Distance (Feet): 40 Feet Assistive device: Rolling walker Ambulation/Gait Assistance Details: pt moves slowly and fatigues quickly, but requires minimal A.   Gait  Pattern: Step-through pattern;Decreased stride length Stairs: No Wheelchair Mobility Wheelchair Mobility: No    Exercises     PT Diagnosis: Difficulty walking;Acute pain  PT Problem List: Decreased activity tolerance;Decreased balance;Decreased mobility;Decreased knowledge of use of DME;Decreased knowledge of precautions;Pain PT Treatment Interventions: DME instruction;Gait training;Stair training;Functional mobility training;Therapeutic activities;Therapeutic exercise;Balance training;Neuromuscular re-education;Patient/family education     PT Goals(Current goals can be found in the care plan section) Acute Rehab PT Goals Patient Stated Goal: None stated.   PT Goal Formulation: With patient Time For Goal Achievement: 02/18/13 Potential to Achieve Goals: Good  Visit Information  Last PT Received On: 02/11/13 Assistance Needed: +1 History of Present Illness: 52 yo female s/p L4-5  PLIF       Prior Functioning  Home Living Family/patient expects to be discharged to:: Private residence Living Arrangements: Children Available Help at Discharge: Family Type of Home: Apartment Home Access: Stairs to enter Secretary/administrator of Steps: 15 Entrance Stairs-Rails: Right Home Layout: One level Home Equipment: None Prior Function Level of Independence: Independent Communication Communication: No difficulties Dominant Hand: Right    Cognition  Cognition Arousal/Alertness: Awake/alert Behavior During Therapy: WFL for tasks assessed/performed Overall Cognitive Status: Within Functional Limits for tasks assessed    Extremity/Trunk Assessment Upper Extremity Assessment Upper Extremity Assessment: Defer to OT evaluation Lower Extremity Assessment Lower Extremity Assessment: Overall WFL for tasks assessed Cervical / Trunk Assessment Cervical / Trunk Assessment: Normal   Balance Balance Balance Assessed: No  End of Session PT - End of Session Equipment Utilized During  Treatment: Gait belt Activity Tolerance: Patient limited by fatigue;Patient limited by pain Patient left: in chair;with call bell/phone  within reach;with family/visitor present Nurse Communication: Mobility status  GP     [name], [place] [phone] 02/11/2013, 10:52 AM

## 2013-02-11 NOTE — Progress Notes (Signed)
UR complete.  Sherian Valenza RN, MSN 

## 2013-02-11 NOTE — Progress Notes (Signed)
Patient ID: Sylvia [name], female   DOB: 02/13/1961, 52 y.o.   MRN: [id number] C/o incisional pain. Ambulating with pt. hemovac working well

## 2013-02-12 NOTE — Progress Notes (Signed)
Physical Therapy Treatment Patient Details Name: Sylvia [name] MRN: [id number] DOB: November 11, 1960 Today's Date: 02/12/2013 Time: 4098-1191 PT Time Calculation (min): 25 min  PT Assessment / Plan / Recommendation  History of Present Illness 52 yo female s/p L4-5  PLIF   PT Comments   Pt able to increase ambulation and perform stairs today, however very fatigued afterwards.  Will continue to follow.    Follow Up Recommendations  Home health PT;Supervision/Assistance - 24 hour     Does the patient have the potential to tolerate intense rehabilitation     Barriers to Discharge        Equipment Recommendations  Rolling walker with 5" wheels    Recommendations for Other Services    Frequency Min 5X/week   Progress towards PT Goals Progress towards PT goals: Progressing toward goals  Plan Current plan remains appropriate    Precautions / Restrictions Precautions Precautions: Back Precaution Comments: pt verbalized 3/3 back precautions.   Restrictions Weight Bearing Restrictions: No   Pertinent Vitals/Pain Low back 5/10 during mobility.      Mobility  Bed Mobility Bed Mobility: Sit to Sidelying Left Sit to Sidelying Left: 4: Min guard;HOB flat Details for Bed Mobility Assistance: pt demos good technique.   Transfers Transfers: Sit to Stand;Stand to Sit Sit to Stand: 5: Supervision;With upper extremity assist;From chair/3-in-1 Stand to Sit: 4: Min guard;With upper extremity assist;To bed Details for Transfer Assistance: demos good use of UEs.   Ambulation/Gait Ambulation/Gait Assistance: 4: Min guard Ambulation Distance (Feet): 70 Feet Assistive device: Rolling walker Ambulation/Gait Assistance Details: pt continues to fatigue easily, but increased ambulation today.   Gait Pattern: Step-through pattern;Decreased stride length Stairs: Yes Stairs Assistance: 4: Min assist Stairs Assistance Details (indicate cue type and reason): cues for stair sequencing, use of rail and how son  should A pt.  pt tends to lean posteriorly and needs cueing for erect posture.   Stair Management Technique: One rail Left;Forwards Number of Stairs: 11 Wheelchair Mobility Wheelchair Mobility: No    Exercises     PT Diagnosis:    PT Problem List:   PT Treatment Interventions:     PT Goals (current goals can now be found in the care plan section) Acute Rehab PT Goals Patient Stated Goal: None stated.   Time For Goal Achievement: 02/18/13 Potential to Achieve Goals: Good  Visit Information  Last PT Received On: 02/12/13 Assistance Needed: +1 History of Present Illness: 52 yo female s/p L4-5  PLIF    Subjective Data  Patient Stated Goal: None stated.     Cognition  Cognition Arousal/Alertness: Awake/alert Behavior During Therapy: WFL for tasks assessed/performed Overall Cognitive Status: Within Functional Limits for tasks assessed    Balance  Balance Balance Assessed: No  End of Session PT - End of Session Equipment Utilized During Treatment: Gait belt Activity Tolerance: Patient limited by fatigue Patient left: in bed;with call bell/phone within reach Nurse Communication: Mobility status   GP     [name], [place] [phone] 02/12/2013, 10:04 AM

## 2013-02-12 NOTE — Progress Notes (Signed)
Patient ID: Sylvia [name], female   DOB: 07-17-1960, 52 y.o.   MRN: [id number] C/o incisional pain. No legs pain. Home tomorrow?

## 2013-02-12 NOTE — Progress Notes (Signed)
Occupational Therapy Treatment Patient Details Name: Sylvia [name] MRN: [id number] DOB: 1960/07/12 Today's Date: 02/12/2013 Time: 4098-1191 OT Time Calculation (min): 15 min  OT Assessment / Plan / Recommendation  History of present illness 52 yo female s/p L4-5  PLIF   OT comments  Pt completing ADLs at sink level supervision level. Pt will have family assistance upon d/c home. Pt progressing well with OT at this time.   Follow Up Recommendations  Home health OT;Supervision - Intermittent    Barriers to Discharge       Equipment Recommendations  Other (comment) (RW)    Recommendations for Other Services    Frequency Min 2X/week   Progress towards OT Goals Progress towards OT goals: Progressing toward goals  Plan Discharge plan remains appropriate    Precautions / Restrictions Precautions Precautions: Back Precaution Comments: pt verbalized 3/3 back precautions.   Restrictions Weight Bearing Restrictions: No   Pertinent Vitals/Pain "its okay" - "i had medicine"  Pt reports pain currently under control but very fatigued from pain during PM hours    ADL  Eating/Feeding: Independent Where Assessed - Eating/Feeding: Chair Grooming: Wash/dry hands;Wash/dry face;Teeth care;Supervision/safety Where Assessed - Grooming: Unsupported standing Upper Body Bathing: Chest;Right arm;Left arm;Abdomen;Supervision/safety Where Assessed - Upper Body Bathing: Unsupported sit to stand Lower Body Bathing: Supervision/safety Where Assessed - Lower Body Bathing: Unsupported sit to stand Upper Body Dressing: Supervision/safety Where Assessed - Upper Body Dressing: Unsupported sit to stand Lower Body Dressing: Supervision/safety (don socks) Where Assessed - Lower Body Dressing: Supported sitting Toilet Transfer: Supervision/safety Statistician Method: Sit to Barista: Raised toilet seat with arms (or 3-in-1 over toilet) Equipment Used: Gait belt;Rolling  walker Transfers/Ambulation Related to ADLs: Pt ambulating with RW Supervision level  ADL Comments: Pt ambulated to bathroom and completed morning adls with good return demo of back precautions. pt reports poor sleep due to pain during the night.     OT Diagnosis:    OT Problem List:   OT Treatment Interventions:     OT Goals(current goals can now be found in the care plan section) Acute Rehab OT Goals Patient Stated Goal: None stated.   OT Goal Formulation: With patient Time For Goal Achievement: 02/25/13 Potential to Achieve Goals: Good ADL Goals Pt Will Perform Grooming: with supervision;standing Pt Will Perform Upper Body Dressing: with supervision;sitting Pt Will Perform Lower Body Dressing: with supervision;sit to/from stand Pt Will Perform Toileting - Clothing Manipulation and hygiene: with supervision;sit to/from stand Pt Will Perform Tub/Shower Transfer: with supervision;ambulating;Tub transfer  Visit Information  Last OT Received On: 02/12/13 Assistance Needed: +1 History of Present Illness: 52 yo female s/p L4-5  PLIF    Subjective Data      Prior Functioning       Cognition  Cognition Arousal/Alertness: Awake/alert Behavior During Therapy: WFL for tasks assessed/performed Overall Cognitive Status: Within Functional Limits for tasks assessed    Mobility  Bed Mobility Bed Mobility: Not assessed Sit to Sidelying Left: 4: Min guard;HOB flat Details for Bed Mobility Assistance: pt demos good technique.   Transfers Transfers: Sit to Stand;Stand to Sit Sit to Stand: 5: Supervision;With upper extremity assist;From chair/3-in-1 Stand to Sit: 5: Supervision;With upper extremity assist;To chair/3-in-1 Details for Transfer Assistance: demos good use of UEs.      Exercises      Balance Balance Balance Assessed: No   End of Session OT - End of Session Activity Tolerance: Patient tolerated treatment well Patient left: in chair;with call bell/phone within  reach Nurse  Communication: Mobility status;Precautions  GO     Harolyn Rutherford 02/12/2013, 10:28 AM Pager: [phone]

## 2013-02-12 NOTE — Care Management Note (Unsigned)
    Page 1 of 2   02/13/2013     2:03:34 PM   CARE MANAGEMENT NOTE 02/13/2013  Patient:  Sylvia [name], Sylvia [name]   Account Number:  [id number]  Date Initiated:  02/11/2013  Documentation initiated by:  Elmer Bales  Subjective/Objective Assessment:   Patient admitted for L4-5 diskectomy. Lives at home with children     Action/Plan:   Will follow for discharge needs   Anticipated DC Date:     Anticipated DC Plan:        DC Planning Services  CM consult      Choice offered to / List presented to:     DME arranged  WALKER - ROLLING        HH arranged  HH-2 PT  HH-3 OT      Status of service:  In process, will continue to follow Medicare Important Message given?   (If response is "NO", the following Medicare IM given date fields will be blank) Date Medicare IM given:   Date Additional Medicare IM given:    Discharge Disposition:    Per UR Regulation:    If discussed at Long Length of Stay Meetings, dates discussed:    Comments:  02/13/13 1400 Elmer Bales RN, MSN, CM- Recieved call from [facility] [employer] stating that rolling walker will be delivered to patient's room by [facility].   02/13/13 0930 Elmer Bales RN, MSN, CM- Recieved call from Mr Lendell Caprice at [employer] comp requesting home health and DME orders for processing.  Orders faxed to [phone] as requested.  At 1030, CM recieved a call from [place] with worker's comp [phone] ext [phone], requesting additional information and orders to contact [facility] directly regarding HH needs.  Information was faxed to [phone].  1345 CM recieved call from Kim at [employer] [phone] ext [phone] requesting copies of the orders as well.  Orders were faxed to [phone].   02/12/13 1100 Elmer Bales RN, MSN, CM- Spoke with Dr Jeral Fruit, who agrees with PT/OT recommendations.  CM spoke with patient, who is interested in [facility] and DME.  Voicemail message was left for patient's worker's comp case manager Sammy Coker [phone]  regarding orders. Await return call on 02/13/13 when Mr Lendell Caprice returns to the office. CM will continue to follow.  02/11/13 1415 Elmer Bales RN, MSN, CM- Recieved call from RN that patient's worker's comp case manager was attempting to reach CM, but call was disconnected.  CM spoke with Tacey Ruiz at Dr Cassandria Santee office, who supplied the name Olen Pel as patient's contact.  Attempted to reach Ms Beam, who has since retired.  Per worker's comp, case is now being covered by [employer] [phone].  CM attempted to reach Mr Lendell Caprice, who had an automated message stating he is out of the office until 02/13/13.  CM will continue attempts to touch base with worker's comp regarding patient's discharge needs.  CM asked nursing staff to provide direct phone number to worker's comp should they attempt to call back.

## 2013-02-13 MED ORDER — ZOLPIDEM TARTRATE 5 MG PO TABS
5.0000 mg | ORAL_TABLET | Freq: Every day | ORAL | Status: DC
  Administered 2013-02-13 – 2013-02-16 (×4): 5 mg via ORAL
  Filled 2013-02-13 (×4): qty 1

## 2013-02-13 MED ORDER — MORPHINE SULFATE 2 MG/ML IJ SOLN
1.0000 mg | INTRAMUSCULAR | Status: DC | PRN
  Administered 2013-02-13: 1 mg via INTRAVENOUS
  Administered 2013-02-13 – 2013-02-15 (×2): 2 mg via INTRAVENOUS
  Filled 2013-02-13 (×3): qty 1

## 2013-02-13 MED FILL — Heparin Sodium (Porcine) Inj 1000 Unit/ML: INTRAMUSCULAR | Qty: 30 | Status: AC

## 2013-02-13 MED FILL — Sodium Chloride IV Soln 0.9%: INTRAVENOUS | Qty: 1000 | Status: AC

## 2013-02-13 NOTE — Progress Notes (Signed)
Occupational Therapy Treatment Patient Details Name: Kadra Kohan MRN: [id number] DOB: Jun 08, 1960 Today's Date: 02/13/2013 Time: 4098-1191 OT Time Calculation (min): 19 min  OT Assessment / Plan / Recommendation  History of present illness 52 yo female s/p L4-5  PLIF   OT comments  Pt is at adequate level to d/c home from OT standpoint but reports severe pain in the PM hours. Pt receiving IV pain medications at this time for pain management. Pt will need assistance with pain management prior to d/c home.   Follow Up Recommendations  Home health OT;Supervision - Intermittent    Barriers to Discharge       Equipment Recommendations  Other (comment)    Recommendations for Other Services    Frequency Min 2X/week   Progress towards OT Goals Progress towards OT goals: Progressing toward goals  Plan Discharge plan remains appropriate    Precautions / Restrictions Precautions Precautions: Back   Pertinent Vitals/Pain 6 out 10  RN providing medication after session    ADL  Tub/Shower Transfer: Supervision/safety Tub/Shower Transfer Method: Ambulating Equipment Used: Gait belt;Rolling walker Transfers/Ambulation Related to ADLs: Pt ambulated > 200 ft with chair being pulled. pt required x3 rest breaks at this time. Pt completed tub transfer and good return demo ADL Comments: Pt reports pain incr at night time from 5-5:30 pm at night. Pt reports completing toilet transfers indep at night due to lack of assistance. Pt will have family assistance at home. Pt progressing well and okay from OT standpoint for d/c. Pt very concerned with pain management    OT Diagnosis:    OT Problem List:   OT Treatment Interventions:     OT Goals(current goals can now be found in the care plan section) Acute Rehab OT Goals Patient Stated Goal: None stated.   OT Goal Formulation: With patient Time For Goal Achievement: 02/25/13 Potential to Achieve Goals: Good ADL Goals Pt Will Perform Grooming: with  supervision;standing Pt Will Perform Upper Body Dressing: with supervision;sitting Pt Will Perform Lower Body Dressing: with supervision;sit to/from stand Pt Will Perform Toileting - Clothing Manipulation and hygiene: with supervision;sit to/from stand Pt Will Perform Tub/Shower Transfer: with supervision;ambulating;Tub transfer  Visit Information  Last OT Received On: 02/13/13 Assistance Needed: +1 History of Present Illness: 52 yo female s/p L4-5  PLIF    Subjective Data      Prior Functioning       Cognition  Cognition Arousal/Alertness: Awake/alert Behavior During Therapy: WFL for tasks assessed/performed Overall Cognitive Status: Within Functional Limits for tasks assessed    Mobility  Bed Mobility Bed Mobility: Not assessed Transfers Transfers: Stand to Sit;Sit to Stand Sit to Stand: 5: Supervision;With upper extremity assist;From chair/3-in-1 Stand to Sit: 5: Supervision;With upper extremity assist;To chair/3-in-1    Exercises      Balance     End of Session OT - End of Session Activity Tolerance: Patient tolerated treatment well Patient left: in chair;with call bell/phone within reach Nurse Communication: Mobility status;Precautions  GO     Harolyn Rutherford 02/13/2013, 11:18 AM Pager: [phone]

## 2013-02-13 NOTE — Progress Notes (Signed)
CSW received referral for questionable SNF.   Chart reviewed and Pt will d/c home with HHPT. RN Case Manager for home health and DME needs.   CSW will sign off. Please re-consult is CSW needs arise.    Sylvia [name] [facility]  [address];  [phone] Phone: [phone]

## 2013-02-13 NOTE — Progress Notes (Signed)
Physical Therapy Treatment Patient Details Name: Sylvia [name] MRN: [id number] DOB: 03/08/1961 Today's Date: 02/13/2013 Time: 4098-1191 PT Time Calculation (min): 11 min  PT Assessment / Plan / Recommendation  History of Present Illness 52 yo female s/p L4-5  PLIF   PT Comments   Pt notes very painful overnight, but doing well this am.    Follow Up Recommendations  Home health PT;Supervision/Assistance - 24 hour     Does the patient have the potential to tolerate intense rehabilitation     Barriers to Discharge        Equipment Recommendations  Rolling walker with 5" wheels    Recommendations for Other Services    Frequency Min 5X/week   Progress towards PT Goals Progress towards PT goals: Progressing toward goals  Plan Current plan remains appropriate    Precautions / Restrictions Precautions Precautions: Back Precaution Comments: pt verbalized 3/3 back precautions.   Restrictions Weight Bearing Restrictions: No   Pertinent Vitals/Pain 5/10 in low back.  Pt indicates premedicated.      Mobility  Bed Mobility Bed Mobility: Rolling Right;Right Sidelying to Sit;Sitting - Scoot to Edge of Bed Rolling Right: 5: Supervision Right Sidelying to Sit: 5: Supervision;HOB elevated Sitting - Scoot to Edge of Bed: 5: Supervision Details for Bed Mobility Assistance: pt demos good technique.   Transfers Transfers: Sit to Stand;Stand to Sit Sit to Stand: 5: Supervision;With upper extremity assist;From bed Stand to Sit: 5: Supervision;With upper extremity assist;To chair/3-in-1 Details for Transfer Assistance: demos good use of UEs.   Ambulation/Gait Ambulation/Gait Assistance: 5: Supervision Ambulation Distance (Feet): 100 Feet Assistive device: Rolling walker Ambulation/Gait Assistance Details: pt moves slowly and needs encouragement to increase distance.   Gait Pattern: Step-through pattern;Decreased stride length Stairs: No Wheelchair Mobility Wheelchair Mobility: No     Exercises     PT Diagnosis:    PT Problem List:   PT Treatment Interventions:     PT Goals (current goals can now be found in the care plan section) Acute Rehab PT Goals Patient Stated Goal: None stated.   Time For Goal Achievement: 02/18/13 Potential to Achieve Goals: Good  Visit Information  Last PT Received On: 02/13/13 Assistance Needed: +1 History of Present Illness: 52 yo female s/p L4-5  PLIF    Subjective Data  Patient Stated Goal: None stated.     Cognition  Cognition Arousal/Alertness: Awake/alert Behavior During Therapy: WFL for tasks assessed/performed Overall Cognitive Status: Within Functional Limits for tasks assessed    Balance  Balance Balance Assessed: No  End of Session PT - End of Session Equipment Utilized During Treatment: Gait belt Activity Tolerance: Patient limited by fatigue Patient left: in chair (with OT) Nurse Communication: Mobility status   GP     [name], [place] [phone] 02/13/2013, 12:10 PM

## 2013-02-13 NOTE — Progress Notes (Signed)
Patient ID: Sylvia [name], female   DOB: 01-Jun-1960, 52 y.o.   MRN: [id number] Pain and spasms more at night. Daytime better. wouind dry. Wants to go home Sunday or Monday. Continue with pt/ot

## 2013-02-14 MED ORDER — DIPHENHYDRAMINE HCL 25 MG PO CAPS
25.0000 mg | ORAL_CAPSULE | Freq: Four times a day (QID) | ORAL | Status: DC | PRN
  Administered 2013-02-14 – 2013-02-16 (×2): 25 mg via ORAL
  Filled 2013-02-14 (×2): qty 1

## 2013-02-14 MED ORDER — DIPHENHYDRAMINE-ZINC ACETATE 2-0.1 % EX CREA
TOPICAL_CREAM | Freq: Three times a day (TID) | CUTANEOUS | Status: DC | PRN
  Administered 2013-02-14 – 2013-02-15 (×2): via TOPICAL
  Filled 2013-02-14: qty 28

## 2013-02-14 NOTE — Progress Notes (Signed)
Patient ID: Sylvia [name], female   DOB: 26-Sep-1960, 52 y.o.   MRN: [id number] BP 110/62  Pulse 87  Temp(Src) 98.1 F (36.7 C) (Oral)  Resp 20  Ht 5\' 2"  (1.575 m)  Wt 50.349 kg (111 lb)  BMI 20.30 kg/m2  SpO2 99%  LMP 06/14/2003 Alert and oriented  X 4, speech is clear and fluent Ambulating well Wound is clean, dry, no signs of infection Still with significant pain, especially at night Continue pt

## 2013-02-14 NOTE — Progress Notes (Signed)
Physical Therapy Treatment Patient Details Name: Sylvia [name] MRN: [id number] DOB: [birth year] Today's Date: 02/14/2013 Time: 7829-5621 PT Time Calculation (min): 24 min  PT Assessment / Plan / Recommendation  History of Present Illness 52 yo female s/p L4-5  PLIF   PT Comments   Patient with increased pain this session and moving slowly with all activity. Did not attempt stair again due to pain. Patient did practice yesterday  Follow Up Recommendations  Home health PT;Supervision/Assistance - 24 hour     Does the patient have the potential to tolerate intense rehabilitation     Barriers to Discharge        Equipment Recommendations  Rolling walker with 5" wheels    Recommendations for Other Services    Frequency Min 5X/week   Progress towards PT Goals Progress towards PT goals: Progressing toward goals  Plan Current plan remains appropriate    Precautions / Restrictions Precautions Precautions: Back Precaution Comments: pt verbalized 3/3 back precautions.     Pertinent Vitals/Pain 9/10 back pain. patient repositioned for comfort     Mobility  Bed Mobility Rolling Right: 5: Supervision Right Sidelying to Sit: 5: Supervision;HOB elevated Details for Bed Mobility Assistance: pt demos good technique.   Transfers Sit to Stand: 5: Supervision;With upper extremity assist;From bed Stand to Sit: 5: Supervision;With upper extremity assist;To chair/3-in-1 Details for Transfer Assistance: Cues for hand placement and not to pull up using RW Ambulation/Gait Ambulation/Gait Assistance: 5: Supervision Ambulation Distance (Feet): 250 Feet Assistive device: Rolling walker Gait Pattern: Step-through pattern;Decreased stride length Gait velocity: very decreased    Exercises     PT Diagnosis:    PT Problem List:   PT Treatment Interventions:     PT Goals (current goals can now be found in the care plan section)    Visit Information  Last PT Received On: 02/14/13 Assistance  Needed: +1 History of Present Illness: 52 yo female s/p L4-5  PLIF    Subjective Data      Cognition  Cognition Arousal/Alertness: Awake/alert Behavior During Therapy: WFL for tasks assessed/performed Overall Cognitive Status: Within Functional Limits for tasks assessed    Balance     End of Session PT - End of Session Equipment Utilized During Treatment: Gait belt Activity Tolerance: Patient limited by pain Patient left: in chair Nurse Communication: Mobility status   GP     Fredrich Birks 02/14/2013, 9:37 AM 02/14/2013 Fredrich Birks PTA [phone] pager [phone] office

## 2013-02-15 MED ORDER — DOCUSATE SODIUM 100 MG PO CAPS
100.0000 mg | ORAL_CAPSULE | Freq: Two times a day (BID) | ORAL | Status: DC
  Administered 2013-02-15 – 2013-02-17 (×5): 100 mg via ORAL
  Filled 2013-02-15 (×5): qty 1

## 2013-02-15 MED ORDER — SENNOSIDES-DOCUSATE SODIUM 8.6-50 MG PO TABS
1.0000 | ORAL_TABLET | Freq: Every evening | ORAL | Status: DC | PRN
  Administered 2013-02-15: 1 via ORAL
  Filled 2013-02-15: qty 1

## 2013-02-15 NOTE — Progress Notes (Signed)
Subjective: Patient reports pain persists  Objective: Vital signs in last 24 hours: Temp:  [97.3 F (36.3 C)-98.3 F (36.8 C)] 97.9 F (36.6 C) (12/07 0930) Pulse Rate:  [78-92] 85 (12/07 0930) Resp:  [18-20] 18 (12/07 0930) BP: (93-115)/(49-64) 108/63 mmHg (12/07 0930) SpO2:  [95 %-100 %] 100 % (12/07 0930)  Intake/Output from previous day: 12/06 0701 - 12/07 0700 In: 240 [P.O.:240] Out: -  Intake/Output this shift:    Physical Exam: Strength in both PF/DF full.  Incision CDI.  Rash from ABD improved after benadryl.  Lab Results: No results found for this basename: WBC, HGB, HCT, PLT,  in the last 72 hours BMET No results found for this basename: NA, K, CL, CO2, GLUCOSE, BUN, CREATININE, CALCIUM,  in the last 72 hours  Studies/Results: No results found.  Assessment/Plan: Still requiring intermittent injections for pain control.  Mobilizing slowly. Anticipate D/C in am if pain better controlled.Has moved bowels.     LOS: 5 days    Dorian Heckle, MD 02/15/2013, 11:44 AM

## 2013-02-15 NOTE — Progress Notes (Signed)
Physical Therapy Treatment Patient Details Name: Sylvia [name] MRN: [id number] DOB: May 09, 1960 Today's Date: 02/15/2013 Time: 1308-6578 PT Time Calculation (min): 26 min  PT Assessment / Plan / Recommendation  History of Present Illness 52 yo female s/p L4-5  PLIF   PT Comments   Pt is able to function independently, using device for ambulatory assist, including ascend/descend stairs to access home.  Required further education regarding postoperative back care and movement precautions as pt is lying prone and climbing into recliner while foot rest is up.  Educated on risks to surgical hardware/site healing as well as potential contribution to back pain.  Educated on use of pillows to support and maintain spinal alignment in sidelying and instructed on gentle exercise to facilitate muscle relaxation.  Pt states 'I'll probably go home tomorrow' so reinforced need to have pain controlled with po meds, pt verbally acknowledges.    Follow Up Recommendations  Home health PT;Supervision/Assistance - 24 hour     Does the patient have the potential to tolerate intense rehabilitation     Barriers to Discharge        Equipment Recommendations  Rolling walker with 5" wheels    Recommendations for Other Services    Frequency Min 5X/week   Progress towards PT Goals Progress towards PT goals: Progressing toward goals  Plan Current plan remains appropriate    Precautions / Restrictions Precautions Precautions: Back Precaution Comments: pt able to verbalize back precautions but does not functionally incorporate consistently.  Educated on risk of loosening hardware if pt moves inappropriately. Per RN, pt is lying prone and is crawling into recliner with foot rest up. Pt also pulling knee to chest to don socks rather that tailor sitting. Instructed on alternatives and clearly explained risks again. Pt acknowledged and demonstrated sock donning appropriately and back into bed safely.  Also instructed on use of  pillow at back and between knees for optimal sidelying support.  Pt need further reinforcement or risks and reasoning behind observing standard back precautions. Required Braces or Orthoses:  (no brace) Restrictions Weight Bearing Restrictions: No Other Position/Activity Restrictions: Pt cannot lie prone following back fusion   Pertinent Vitals/Pain Not rated, along hips and thighs    Mobility  Bed Mobility Bed Mobility: Left Sidelying to Sit;Sit to Sidelying Left Left Sidelying to Sit: HOB flat;With rails;6: Modified independent (Device/Increase time) Sit to Sidelying Left: 5: Supervision;HOB flat;With rail Details for Bed Mobility Assistance: observed and provided instructional cues for safest technique returning to side/supine Transfers Transfers: Sit to Stand;Stand to Sit Sit to Stand: 6: Modified independent (Device/Increase time);From bed (to RW) Stand to Sit: 6: Modified independent (Device/Increase time);To bed (from RW) Details for Transfer Assistance: observed for safety, no cues needed to maintain during transitional movement Ambulation/Gait Ambulation/Gait Assistance: 5: Supervision Ambulation Distance (Feet): 125 Feet Assistive device: Rolling walker Ambulation/Gait Assistance Details: lowered RW to improve UE positioning and instructional cues to increase speed, relax grip, incorporate deep breathing Gait Pattern: Step-through pattern;Antalgic Gait velocity: slow, unmeasured, educated on increased effort required with slower gait Stairs: Yes Stairs Assistance: 5: Supervision Stairs Assistance Details (indicate cue type and reason): instructional cues on use of rails and alternative techniques including sideways rather that forward; pt prefers sideways, feels safer Stair Management Technique: One rail Left;Forwards;Sideways Number of Stairs: 12    Exercises     PT Diagnosis:    PT Problem List:   PT Treatment Interventions:     PT Goals (current goals can now be  found in the  care plan section) Acute Rehab PT Goals PT Goal Formulation: With patient Time For Goal Achievement: 02/18/13 Potential to Achieve Goals: Good  Visit Information  Last PT Received On: 02/15/13 Assistance Needed: +1 History of Present Illness: 52 yo female s/p L4-5  PLIF    Subjective Data  Subjective: the pain at night is much worse   Cognition  Cognition Arousal/Alertness: Awake/alert Behavior During Therapy: WFL for tasks assessed/performed Overall Cognitive Status: Within Functional Limits for tasks assessed    Balance  Balance Balance Assessed: No  End of Session PT - End of Session Activity Tolerance: Patient limited by pain Patient left: in bed;with call bell/phone within reach Nurse Communication: Mobility status   GP     Dennis Bast 02/15/2013, 10:07 AM

## 2013-02-16 NOTE — Progress Notes (Signed)
Patient ID: Sylvia [name], female   DOB: [birth year], 52 y.o.   MRN: [id number] Still having a lot of pain mostly at night. Lives alone. Want to wait till tomorrow

## 2013-02-16 NOTE — Progress Notes (Signed)
Physical Therapy Treatment Patient Details Name: Sylvia [name] MRN: [id number] DOB: 1960/11/04 Today's Date: 02/16/2013 Time: 1191-4782 PT Time Calculation (min): 19 min  PT Assessment / Plan / Recommendation  History of Present Illness 52 yo female s/p L4-5  PLIF   PT Comments   Pt indicates only painful at night, otherwise doing great.    Follow Up Recommendations  Home health PT;Supervision/Assistance - 24 hour     Does the patient have the potential to tolerate intense rehabilitation     Barriers to Discharge        Equipment Recommendations  Rolling walker with 5" wheels    Recommendations for Other Services    Frequency Min 5X/week   Progress towards PT Goals Progress towards PT goals: Progressing toward goals  Plan Current plan remains appropriate    Precautions / Restrictions Precautions Precautions: Back Precaution Comments: pt able to verbalize back precautions but does not functionally incorporate consistently.  Educated on risk of loosening hardware if pt moves inappropriately. Per RN, pt is lying prone and is crawling into recliner with foot rest up. Pt also pulling knee to chest to don socks rather that tailor sitting. Instructed on alternatives and clearly explained risks again. Pt acknowledged and demonstrated sock donning appropriately and back into bed safely.  Also instructed on use of pillow at back and between knees for optimal sidelying support.  Pt need further reinforcement or risks and reasoning behind observing standard back precautions. Restrictions Weight Bearing Restrictions: No   Pertinent Vitals/Pain "Very little"      Mobility  Bed Mobility Bed Mobility: Not assessed Transfers Transfers: Sit to Stand;Stand to Sit Sit to Stand: 6: Modified independent (Device/Increase time);With upper extremity assist;From chair/3-in-1 Stand to Sit: 6: Modified independent (Device/Increase time);With upper extremity assist;To  chair/3-in-1 Ambulation/Gait Ambulation/Gait Assistance: 5: Supervision Ambulation Distance (Feet): 500 Feet Assistive device: Rolling walker Ambulation/Gait Assistance Details: pt moving great.  continues to note minimal pain with mobility.   Gait Pattern: Step-through pattern;Decreased stride length Stairs: No Wheelchair Mobility Wheelchair Mobility: No    Exercises     PT Diagnosis:    PT Problem List:   PT Treatment Interventions:     PT Goals (current goals can now be found in the care plan section) Acute Rehab PT Goals Patient Stated Goal: None stated.   Time For Goal Achievement: 02/18/13 Potential to Achieve Goals: Good  Visit Information  Last PT Received On: 02/16/13 Assistance Needed: +1 History of Present Illness: 52 yo female s/p L4-5  PLIF    Subjective Data  Patient Stated Goal: None stated.     Cognition  Cognition Arousal/Alertness: Awake/alert Behavior During Therapy: WFL for tasks assessed/performed Overall Cognitive Status: Within Functional Limits for tasks assessed    Balance  Balance Balance Assessed: No  End of Session PT - End of Session Activity Tolerance: Patient tolerated treatment well Patient left: in chair;with call bell/phone within reach Nurse Communication: Mobility status   GP     [name], [place] [phone] 02/16/2013, 12:16 PM

## 2013-02-16 NOTE — Progress Notes (Signed)
Occupational Therapy Treatment Patient Details Name: Sylvia [name] MRN: [id number] DOB: 1961/03/06 Today's Date: 02/16/2013 Time: 2952-8413 OT Time Calculation (min): 11 min  OT Assessment / Plan / Recommendation  History of present illness 52 yo female s/p L4-5  PLIF   OT comments  Pt is at an adequate level for d/c home from OT standpoint. Pt maintaining back precautions during session and no questions at this time. Pt is ambulating around unit to change positions every 45 minutes as educated.    Follow Up Recommendations  Home health OT;Supervision - Intermittent    Barriers to Discharge       Equipment Recommendations  Other (comment)    Recommendations for Other Services    Frequency Min 2X/week   Progress towards OT Goals Progress towards OT goals: Progressing toward goals  Plan Discharge plan remains appropriate    Precautions / Restrictions Precautions Precautions: Back   Pertinent Vitals/Pain 4 out 10 pain    ADL  Eating/Feeding: Independent Where Assessed - Eating/Feeding: Chair Toilet Transfer: Supervision/safety Toilet Transfer Method: Sit to stand Toilet Transfer Equipment: Raised toilet seat with arms (or 3-in-1 over toilet) Equipment Used: Rolling walker Transfers/Ambulation Related to ADLs: Pt ambulating in the hall supervision level on arrival. Pt ambulated back to room. Pt verbalized ambulating to decr pain. Pt completing 3 laps this Am. Pt encouraged to take a rest break now. PT Aundra Millet will help with ambulation again in a little while. Pt educated on the need to do small amounts of walking and not long distances. pt educated 6 laps around unit is 1 mile. ADL Comments: Pt declined adls at this time stating "i did that last night before bed" Pt positioned in chair with all personal items. Pt recalling back precautions. Pt continues to report PM hour having incr pain. Pt does state that sleeping medication and pain medication changes have helped over the weekend.      OT Diagnosis:    OT Problem List:   OT Treatment Interventions:     OT Goals(current goals can now be found in the care plan section) Acute Rehab OT Goals Patient Stated Goal: None stated.   OT Goal Formulation: With patient Time For Goal Achievement: 02/25/13 Potential to Achieve Goals: Good ADL Goals Pt Will Perform Grooming: with supervision;standing Pt Will Perform Upper Body Dressing: with supervision;sitting Pt Will Perform Lower Body Dressing: with supervision;sit to/from stand Pt Will Perform Toileting - Clothing Manipulation and hygiene: with supervision;sit to/from stand Pt Will Perform Tub/Shower Transfer: with supervision;ambulating;Tub transfer  Visit Information  Last OT Received On: 02/16/13 Assistance Needed: +1 History of Present Illness: 52 yo female s/p L4-5  PLIF    Subjective Data      Prior Functioning       Cognition  Cognition Arousal/Alertness: Awake/alert Behavior During Therapy: WFL for tasks assessed/performed Overall Cognitive Status: Within Functional Limits for tasks assessed    Mobility  Bed Mobility Bed Mobility: Not assessed Transfers Transfers: Sit to Stand;Stand to Sit Sit to Stand: 6: Modified independent (Device/Increase time);From bed Stand to Sit: 6: Modified independent (Device/Increase time);To bed    Exercises      Balance     End of Session OT - End of Session Activity Tolerance: Patient tolerated treatment well Patient left: in chair;with call bell/phone within reach Nurse Communication: Mobility status;Precautions  GO     Harolyn Rutherford 02/16/2013, 8:54 AM Pager: [phone]

## 2013-02-17 NOTE — Discharge Summary (Signed)
Physician Discharge Summary  Patient ID: Sylvia [name] MRN: [id number] DOB/AGE: August 25, 1960 52 y.o.  Admit date: 02/10/2013 Discharge date: 02/17/2013  Admission Diagnoses:l4-5 ddd   Discharge Diagnoses:  Active Problems:   Lumbar disc disease with radiculopathy   Discharged Condition: incisional pain  Hospital Course: surgery  Consults: none  Significant Diagnostic Studies: myelogram  Treatments: l4-5 fusion  Discharge Exam: Blood pressure 101/66, pulse 78, temperature 98 F (36.7 C), temperature source Oral, resp. rate 16, height 5\' 2"  (1.575 m), weight 50.349 kg (111 lb), last menstrual period 06/14/2003, SpO2 97.00%. Wound dry. No weakness  Disposition: home     Medication List    ASK your doctor about these medications       acetaminophen 500 MG tablet  Commonly known as:  TYLENOL  Take 1,000 mg by mouth every 6 (six) hours as needed.     esomeprazole 20 MG capsule  Commonly known as:  NEXIUM  Take 20 mg by mouth daily as needed (acid reflux).     ibuprofen 200 MG tablet  Commonly known as:  ADVIL,MOTRIN  Take 400 mg by mouth every 6 (six) hours as needed for moderate pain.     multivitamin with minerals tablet  Take 1 tablet by mouth daily.     Oxycodone HCl 10 MG Tabs  Take 10 mg by mouth at bedtime as needed (pain).     traMADol 50 MG tablet  Commonly known as:  ULTRAM  Take 50 mg by mouth every 6 (six) hours as needed for moderate pain.     venlafaxine XR 75 MG 24 hr capsule  Commonly known as:  EFFEXOR-XR  Take 75 mg by mouth daily.         Signed: Karn Cassis 02/17/2013, 10:10 AM

## 2013-02-17 NOTE — Progress Notes (Signed)
Physical Therapy Treatment Patient Details Name: Karista Aispuro MRN: [id number] DOB: 07/11/1960 Today's Date: 02/17/2013 Time: 4098-1191 PT Time Calculation (min): 23 min  PT Assessment / Plan / Recommendation  History of Present Illness 52 yo female s/p L4-5  PLIF   PT Comments   Pt progressing well with mobility; encouraged ambulation with nursing staff. Will sign off with acute PT at this time; no HHPT recommended.   Follow Up Recommendations  No PT follow up     Does the patient have the potential to tolerate intense rehabilitation     Barriers to Discharge        Equipment Recommendations  Rolling walker with 5" wheels    Recommendations for Other Services    Frequency     Progress towards PT Goals Progress towards PT goals: Goals met/education completed, patient discharged from PT  Plan      Precautions / Restrictions Precautions Precautions: Back Precaution Comments: pt able to verbalize precautions and maintain throughout tx Restrictions Weight Bearing Restrictions: No   Pertinent Vitals/Pain Denied pain this morning; pain at night; pt premedicated per pt    Mobility  Bed Mobility Bed Mobility: Rolling Right;Right Sidelying to Sit;Sitting - Scoot to [place] of Bed Rolling Right: 6: Modified independent (Device/Increase time) Right Sidelying to Sit: 6: Modified independent (Device/Increase time) Sitting - Scoot to Edge of Bed: 6: Modified independent (Device/Increase time) Details for Bed Mobility Assistance: HOB flat; no rails; pt demo'd safe technique Transfers Transfers: Sit to Stand;Stand to Sit Sit to Stand: 6: Modified independent (Device/Increase time);From bed Stand to Sit: 6: Modified independent (Device/Increase time);To chair/3-in-1 Ambulation/Gait Ambulation/Gait Assistance: 6: Modified independent (Device/Increase time) Ambulation Distance (Feet): 700 Feet Assistive device: Rolling walker Gait Pattern: Within Functional Limits Stairs Assistance: 5:  Supervision Stairs Assistance Details (indicate cue type and reason): no cues needed; pt with correct technique Stair Management Technique: One rail Left;Forwards;Sideways Number of Stairs: 12    Exercises     PT Diagnosis:    PT Problem List:   PT Treatment Interventions:     PT Goals (current goals can now be found in the care plan section)    Visit Information  Last PT Received On: 02/17/13 Assistance Needed: +1 History of Present Illness: 52 yo female s/p L4-5  PLIF    Subjective Data      Cognition  Cognition Arousal/Alertness: Awake/alert Behavior During Therapy: WFL for tasks assessed/performed Overall Cognitive Status: Within Functional Limits for tasks assessed    Balance     End of Session PT - End of Session Equipment Utilized During Treatment: Gait belt Activity Tolerance: Patient tolerated treatment well Patient left: in chair;with call bell/phone within reach Nurse Communication: Mobility status   GP     Jabri Blancett, Irving Burton, SPTA 02/17/2013, 9:11 AM

## 2013-02-17 NOTE — Progress Notes (Signed)
Pt. DC home via car with family.  DC instructions given and understood by patient.  Assessments were stable.

## 2013-02-17 NOTE — Progress Notes (Signed)
Agree with SPTA.    Wilmoth Rasnic, PT [phone]  

## 2013-02-17 NOTE — Progress Notes (Signed)
UR complete.  Tricia Pledger RN, MSN 

## 2015-03-24 ENCOUNTER — Other Ambulatory Visit: Payer: Self-pay | Admitting: Neurosurgery

## 2015-03-24 DIAGNOSIS — M545 Low back pain: Secondary | ICD-10-CM

## 2015-04-02 ENCOUNTER — Other Ambulatory Visit: Payer: Self-pay

## 2016-03-19 ENCOUNTER — Other Ambulatory Visit: Payer: Self-pay | Admitting: Neurosurgery

## 2016-03-19 DIAGNOSIS — M5137 Other intervertebral disc degeneration, lumbosacral region: Secondary | ICD-10-CM

## 2016-03-28 ENCOUNTER — Other Ambulatory Visit: Payer: Self-pay

## 2016-03-28 ENCOUNTER — Inpatient Hospital Stay: Admission: RE | Admit: 2016-03-28 | Payer: Self-pay | Source: Ambulatory Visit

## 2016-04-03 ENCOUNTER — Other Ambulatory Visit: Payer: Self-pay

## 2016-04-17 ENCOUNTER — Ambulatory Visit
Admission: RE | Admit: 2016-04-17 | Discharge: 2016-04-17 | Disposition: A | Discharge status: Home / Self Care | Payer: Worker's Compensation | Source: Ambulatory Visit | Attending: Neurosurgery | Admitting: Neurosurgery

## 2016-04-17 VITALS — BP 131/67 | HR 71

## 2016-04-17 DIAGNOSIS — M5137 Other intervertebral disc degeneration, lumbosacral region: Secondary | ICD-10-CM

## 2016-04-17 DIAGNOSIS — M5116 Intervertebral disc disorders with radiculopathy, lumbar region: Secondary | ICD-10-CM

## 2016-04-17 DIAGNOSIS — M5126 Other intervertebral disc displacement, lumbar region: Secondary | ICD-10-CM

## 2016-04-17 MED ORDER — MEPERIDINE HCL 100 MG/ML IJ SOLN
50.0000 mg | Freq: Once | INTRAMUSCULAR | Status: AC
  Administered 2016-04-17: 50 mg via INTRAMUSCULAR

## 2016-04-17 MED ORDER — DIAZEPAM 5 MG PO TABS
5.0000 mg | ORAL_TABLET | Freq: Once | ORAL | Status: AC
  Administered 2016-04-17: 5 mg via ORAL

## 2016-04-17 MED ORDER — ONDANSETRON HCL 4 MG/2ML IJ SOLN: 4.0000 mg | Freq: Four times a day (QID) | INTRAMUSCULAR | Status: DC | PRN

## 2016-04-17 MED ORDER — ONDANSETRON HCL 4 MG/2ML IJ SOLN
4.0000 mg | Freq: Once | INTRAMUSCULAR | Status: AC
Start: 2016-04-17 — End: 2016-04-17
  Administered 2016-04-17: 4 mg via INTRAMUSCULAR

## 2016-04-17 MED ORDER — IOPAMIDOL (ISOVUE-M 200) INJECTION 41%
15.0000 mL | Freq: Once | INTRAMUSCULAR | Status: AC
  Administered 2016-04-17: 15 mL via INTRATHECAL

## 2016-04-17 NOTE — Discharge Instructions (Signed)
Myelogram Discharge Instructions  1. Go home and rest quietly for the next 24 hours.  It is important to lie flat for the next 24 hours.  Get up only to go to the restroom.  You may lie in the bed or on a couch on your back, your stomach, your left side or your right side.  You may have one pillow under your head.  You may have pillows between your knees while you are on your side or under your knees while you are on your back.  2. DO NOT drive today.  Recline the seat as far back as it will go, while still wearing your seat belt, on the way home.  3. You may get up to go to the bathroom as needed.  You may sit up for 10 minutes to eat.  You may resume your normal diet and medications unless otherwise indicated.  Drink plenty of extra fluids today and tomorrow.  4. The incidence of a spinal headache with nausea and/or vomiting is about 5% (one in 20 patients).  If you develop a headache, lie flat and drink plenty of fluids until the headache goes away.  Caffeinated beverages may be helpful.  If you develop severe nausea and vomiting or a headache that does not go away with flat bed rest, call [phone].  5. You may resume normal activities after your 24 hours of bed rest is over; however, do not exert yourself strongly or do any heavy lifting tomorrow.  6. Call your physician for a follow-up appointment.    You may resume Effexor and Tramadol on Wednesday, April 18, 2016 after 10:30a.m.

## 2016-04-17 NOTE — Progress Notes (Signed)
Patient states she has been off Effexor and Tramadol for at least the past two days.  jkl

## 2020-06-09 ENCOUNTER — Other Ambulatory Visit: Payer: Self-pay | Admitting: Neurosurgery

## 2020-06-09 DIAGNOSIS — M5416 Radiculopathy, lumbar region: Secondary | ICD-10-CM

## 2020-06-12 ENCOUNTER — Other Ambulatory Visit: Payer: Self-pay

## 2020-06-12 ENCOUNTER — Ambulatory Visit
Admission: RE | Admit: 2020-06-12 | Discharge: 2020-06-12 | Disposition: A | Discharge status: Home / Self Care | Payer: Worker's Compensation | Source: Ambulatory Visit | Attending: Neurosurgery | Admitting: Neurosurgery

## 2020-06-12 DIAGNOSIS — M5416 Radiculopathy, lumbar region: Secondary | ICD-10-CM

## 2021-10-17 DIAGNOSIS — J3089 Other allergic rhinitis: Secondary | ICD-10-CM | POA: Diagnosis not present

## 2023-02-27 IMAGING — MR MR LUMBAR SPINE W/O CM
4 of 5 series · 28 of 48 positions shown · non-contrast
Comparison: Lumbar CT myelogram 04/17/2016

CLINICAL DATA: Low back pain radiating into the hips and legs with
bilateral leg numbness and weakness. Prior lumbar surgery.

EXAM:
MRI LUMBAR SPINE WITHOUT CONTRAST
TECHNIQUE: Multiplanar, multisequence MR imaging of the lumbar spine was
performed. No intravenous contrast was administered.

[Series 3: T2 · sagittal · 4.0mm · 1.09mm/px · 6 of 20 slices shown (1 of 2)]
[im 1/20]
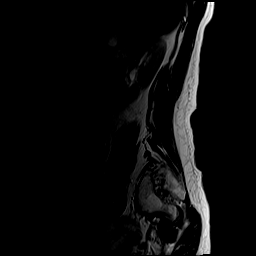
[im 4/20]
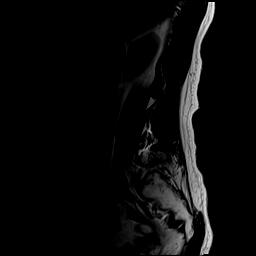
[im 8/20]
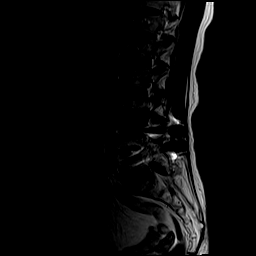
[im 12/20]
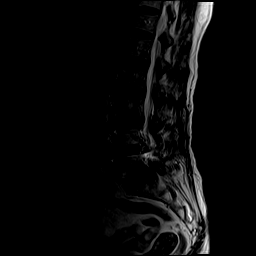
[im 16/20]
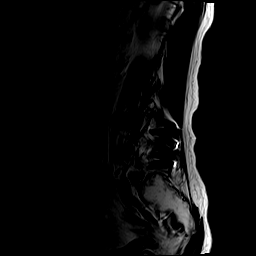
[im 20/20]
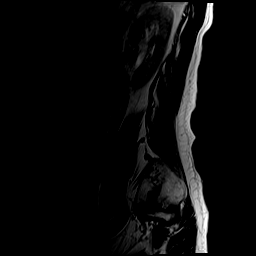

[Series 5: T1 · sagittal · 4.0mm · 1.09mm/px · 7 of 20 slices shown (1 of 2)]
[im 1/20]
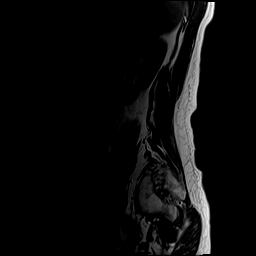
[im 4/20]
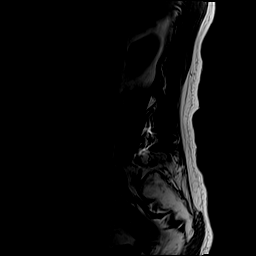
[im 7/20]
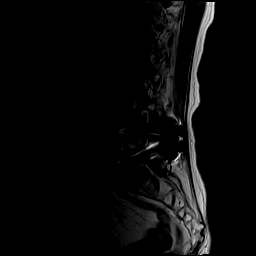
[im 10/20]
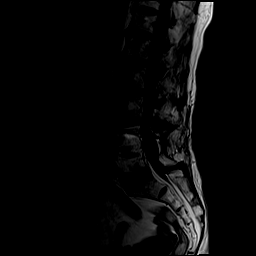
[im 13/20]
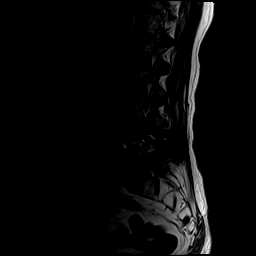
[im 16/20]
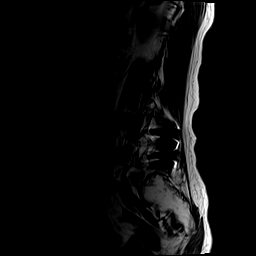
[im 20/20]
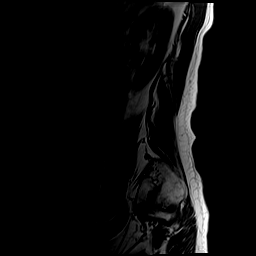

[Series 6: T2 · axial · 4.0mm · 0.39mm/px · z∈[-118,+96]mm · 8 of 43 slices shown (2 of 2)]
[im 1/43]
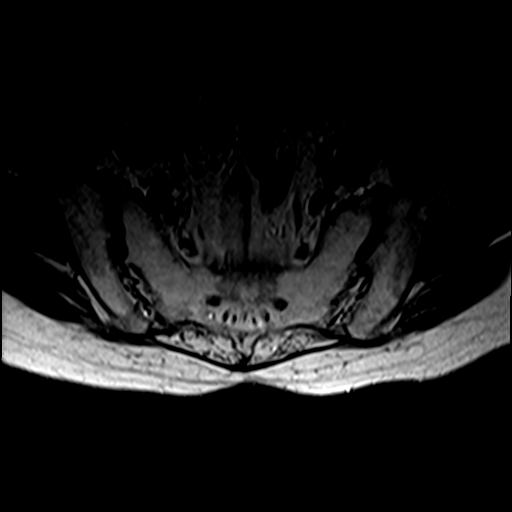
[im 7/43]
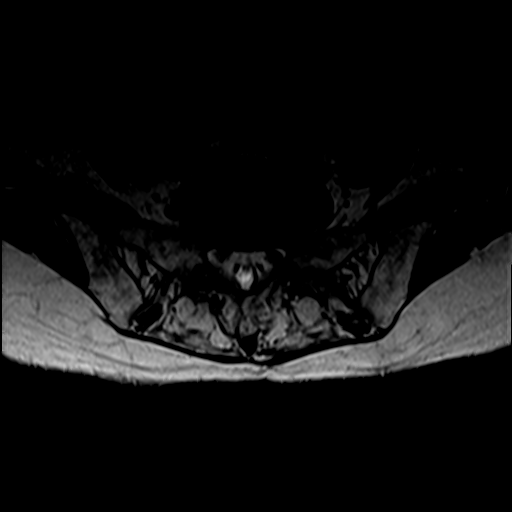
[im 13/43]
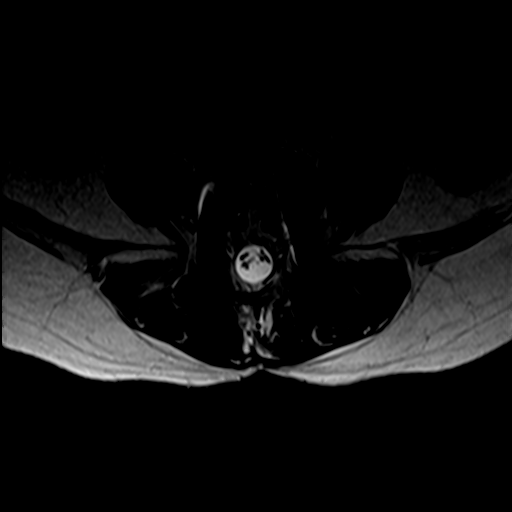
[im 20/43]
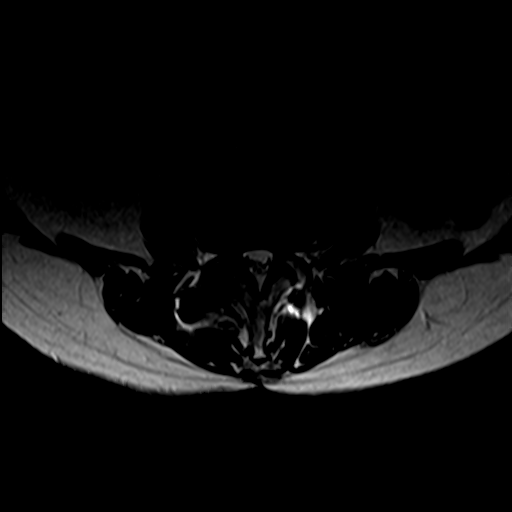
[im 23/43]
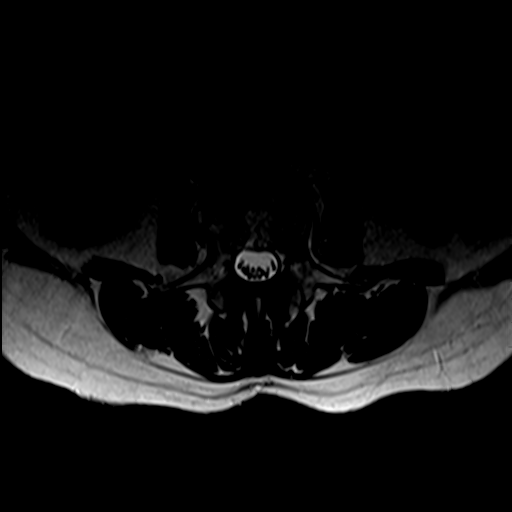
[im 30/43]
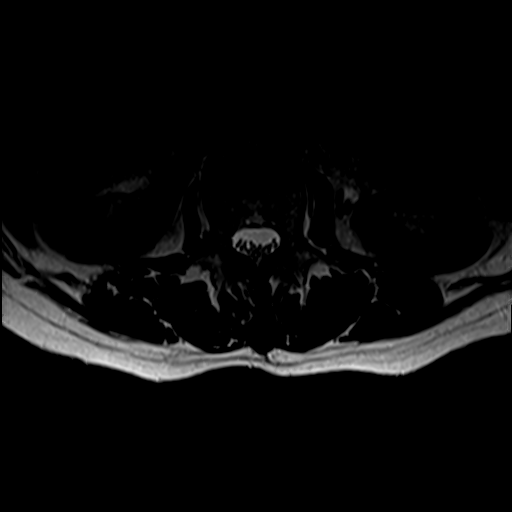
[im 36/43]
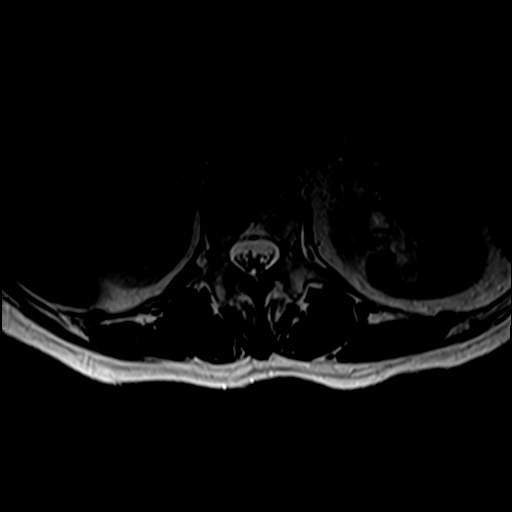
[im 43/43]
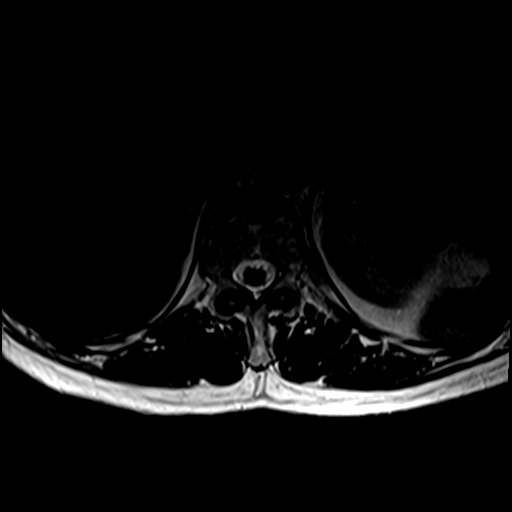

[Series 7: T1 · axial · 4.0mm · 0.39mm/px · z∈[-118,+63]mm · 7 of 43 slices shown (2 of 2)]
[im 1/43]
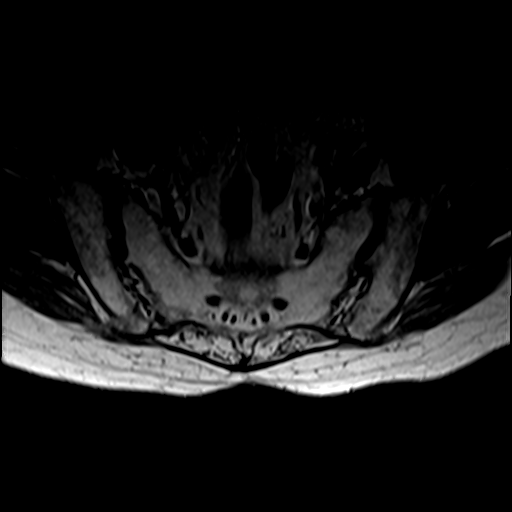
[im 7/43]
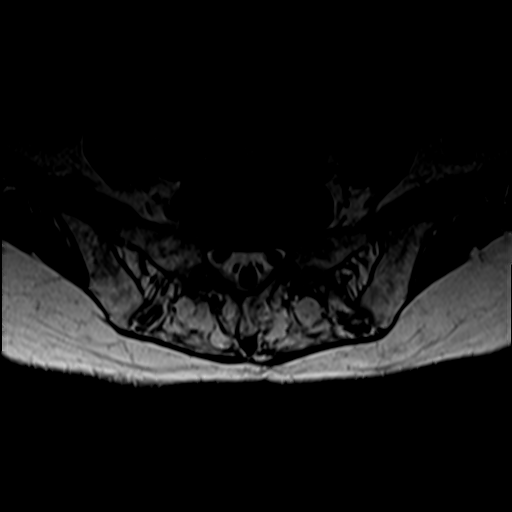
[im 13/43]
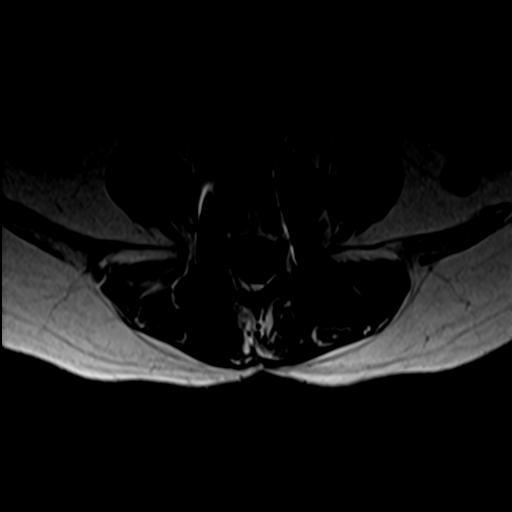
[im 20/43]
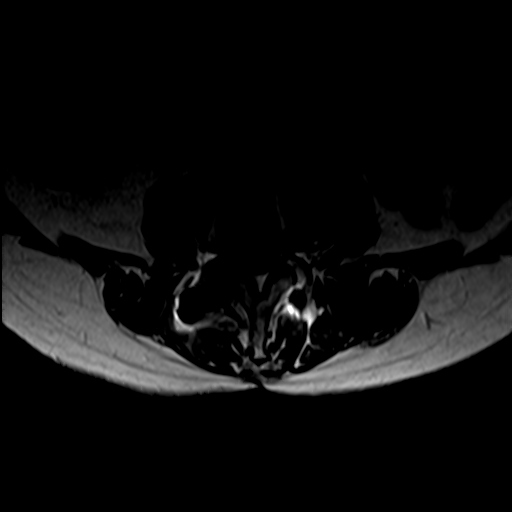
[im 23/43]
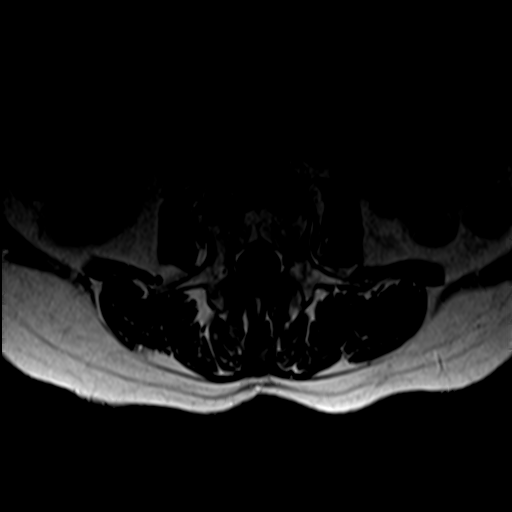
[im 30/43]
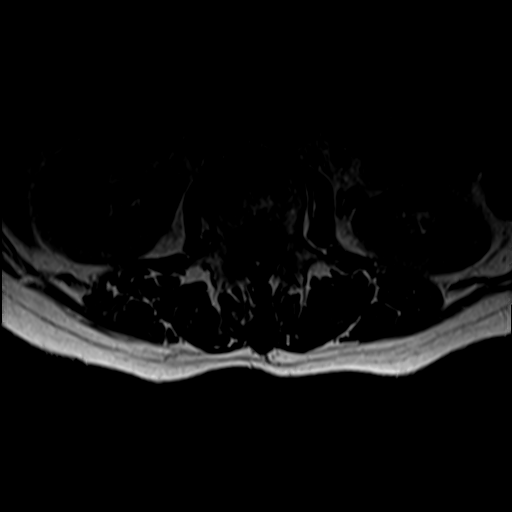
[im 36/43]
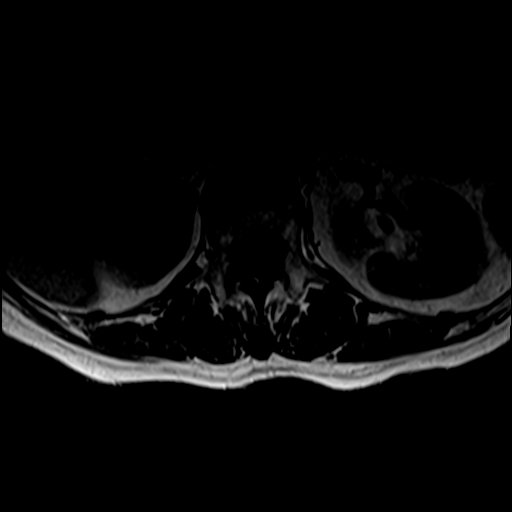

[28 of 48 positions shown; findings below may reference images not displayed]

FINDINGS: Segmentation:  Standard.

Alignment:  Unchanged.  No significant listhesis.

Vertebrae: No fracture, suspicious osseous lesion, or significant
marrow edema. Prior posterior and interbody fusion at L4-5 with
evidence of solid interbody arthrodesis.

Conus medullaris and cauda equina: Conus extends to the L1-2 level.
Conus and cauda equina appear normal.

Paraspinal and other soft tissues: Postoperative changes in the
posterior lower lumbar soft tissues. No fluid collection.

Disc levels:

T12-L1: Tiny partially calcified right paracentral disc protrusion
without stenosis, unchanged.

L1-2: Tiny partially calcified central disc protrusion and minor
left facet spurring without stenosis, unchanged.

L2-3: Mild facet and ligamentum flavum hypertrophy without disc
herniation or stenosis, unchanged.

L3-4: Disc desiccation. Mild disc bulging and mild facet and
ligamentum flavum hypertrophy result in mild-to-moderate bilateral
lateral recess stenosis and mild-to-moderate bilateral neural
foraminal stenosis, overall mildly progressed. No significant
generalized spinal stenosis.

L4-5: Prior posterior decompression and fusion.  No stenosis.

L5-S1: Mild facet arthrosis and at most minimal disc bulging without
stenosis.
IMPRESSION: 1. Mildly progressive adjacent segment disease at L3-4 with
mild-to-moderate lateral recess and neural foraminal stenosis.
2. Prior L4-5 fusion without stenosis.
